# Patient Record
Sex: Female | Born: 1955
Health system: Southern US, Community
[De-identification: ages and names within clinical notes are randomized; demographics above are authoritative.]

## PROBLEM LIST (undated history)

## (undated) DIAGNOSIS — K8020 Calculus of gallbladder without cholecystitis without obstruction: Secondary | ICD-10-CM

## (undated) DIAGNOSIS — C801 Malignant (primary) neoplasm, unspecified: Secondary | ICD-10-CM

## (undated) DIAGNOSIS — I10 Essential (primary) hypertension: Secondary | ICD-10-CM

## (undated) DIAGNOSIS — G4730 Sleep apnea, unspecified: Secondary | ICD-10-CM

## (undated) DIAGNOSIS — F329 Major depressive disorder, single episode, unspecified: Secondary | ICD-10-CM

## (undated) DIAGNOSIS — F419 Anxiety disorder, unspecified: Secondary | ICD-10-CM

## (undated) DIAGNOSIS — F32A Depression, unspecified: Secondary | ICD-10-CM

## (undated) DIAGNOSIS — M1990 Unspecified osteoarthritis, unspecified site: Secondary | ICD-10-CM

## (undated) DIAGNOSIS — T7840XA Allergy, unspecified, initial encounter: Secondary | ICD-10-CM

## (undated) HISTORY — DX: Depression, unspecified: F32.A

## (undated) HISTORY — DX: Major depressive disorder, single episode, unspecified: F32.9

## (undated) HISTORY — DX: Sleep apnea, unspecified: G47.30

## (undated) HISTORY — PX: OTHER SURGICAL HISTORY: SHX169

## (undated) HISTORY — DX: Unspecified osteoarthritis, unspecified site: M19.90

## (undated) HISTORY — DX: Allergy, unspecified, initial encounter: T78.40XA

## (undated) HISTORY — DX: Anxiety disorder, unspecified: F41.9

---

## 1997-10-03 ENCOUNTER — Other Ambulatory Visit: Admission: RE | Admit: 1997-10-03 | Discharge: 1997-10-03 | Payer: Self-pay | Admitting: Obstetrics & Gynecology

## 1998-10-06 ENCOUNTER — Other Ambulatory Visit: Admission: RE | Admit: 1998-10-06 | Discharge: 1998-10-06 | Payer: Self-pay | Admitting: Obstetrics & Gynecology

## 1999-09-29 ENCOUNTER — Other Ambulatory Visit: Admission: RE | Admit: 1999-09-29 | Discharge: 1999-09-29 | Payer: Self-pay | Admitting: Obstetrics & Gynecology

## 2000-10-04 ENCOUNTER — Other Ambulatory Visit: Admission: RE | Admit: 2000-10-04 | Discharge: 2000-10-04 | Payer: Self-pay | Admitting: Obstetrics & Gynecology

## 2001-12-05 ENCOUNTER — Other Ambulatory Visit: Admission: RE | Admit: 2001-12-05 | Discharge: 2001-12-05 | Payer: Self-pay | Admitting: Obstetrics & Gynecology

## 2001-12-11 ENCOUNTER — Encounter: Payer: Self-pay | Admitting: Obstetrics & Gynecology

## 2001-12-11 ENCOUNTER — Encounter: Admission: RE | Admit: 2001-12-11 | Discharge: 2001-12-11 | Payer: Self-pay | Admitting: Obstetrics & Gynecology

## 2003-01-14 ENCOUNTER — Other Ambulatory Visit: Admission: RE | Admit: 2003-01-14 | Discharge: 2003-01-14 | Payer: Self-pay | Admitting: Obstetrics & Gynecology

## 2004-03-25 ENCOUNTER — Other Ambulatory Visit: Admission: RE | Admit: 2004-03-25 | Discharge: 2004-03-25 | Payer: Self-pay | Admitting: Obstetrics & Gynecology

## 2004-11-30 DIAGNOSIS — I10 Essential (primary) hypertension: Secondary | ICD-10-CM | POA: Insufficient documentation

## 2006-04-16 ENCOUNTER — Emergency Department: Payer: Self-pay | Admitting: Emergency Medicine

## 2008-03-05 ENCOUNTER — Inpatient Hospital Stay (HOSPITAL_COMMUNITY): Admission: EM | Admit: 2008-03-05 | Discharge: 2008-03-12 | Payer: Self-pay | Admitting: Emergency Medicine

## 2008-03-07 ENCOUNTER — Encounter (INDEPENDENT_AMBULATORY_CARE_PROVIDER_SITE_OTHER): Payer: Self-pay | Admitting: Orthopedic Surgery

## 2010-04-30 ENCOUNTER — Inpatient Hospital Stay: Payer: Self-pay | Admitting: Psychiatry

## 2010-05-24 LAB — COMPREHENSIVE METABOLIC PANEL
ALT: 17 U/L (ref 0–35)
AST: 55 U/L — ABNORMAL HIGH (ref 0–37)
Albumin: 2.4 g/dL — ABNORMAL LOW (ref 3.5–5.2)
Alkaline Phosphatase: 66 U/L (ref 39–117)
BUN: 3 mg/dL — ABNORMAL LOW (ref 6–23)
CO2: 29 mEq/L (ref 19–32)
Calcium: 7.8 mg/dL — ABNORMAL LOW (ref 8.4–10.5)
Chloride: 96 mEq/L (ref 96–112)
Creatinine, Ser: 0.77 mg/dL (ref 0.4–1.2)
GFR calc Af Amer: >60 mL/min (ref >60)
GFR calc non Af Amer: >60 mL/min (ref >60)
Glucose, Bld: 233 mg/dL — ABNORMAL HIGH (ref 70–99)
Potassium: 3 mEq/L — ABNORMAL LOW (ref 3.5–5.1)
Sodium: 134 mEq/L — ABNORMAL LOW (ref 135–145)
Total Bilirubin: 0.7 mg/dL (ref 0.3–1.2)
Total Protein: 4.7 g/dL — ABNORMAL LOW (ref 6.0–8.3)

## 2010-05-24 LAB — HEMOGLOBIN AND HEMATOCRIT, BLOOD
HCT: 23.5 % — ABNORMAL LOW (ref 36.0–46.0)
Hemoglobin: 8.1 g/dL — ABNORMAL LOW (ref 12.0–15.0)

## 2010-05-24 LAB — WOUND CULTURE

## 2010-05-24 LAB — CBC
HCT: 23.4 % — ABNORMAL LOW (ref 36.0–46.0)
HCT: 34.3 % — ABNORMAL LOW (ref 36.0–46.0)
Hemoglobin: 11.3 g/dL — ABNORMAL LOW (ref 12.0–15.0)
Hemoglobin: 7.9 g/dL — CL (ref 12.0–15.0)
MCHC: 33.6 g/dL (ref 30.0–36.0)
MCV: 94.4 fL (ref 78.0–100.0)
Platelets: 263 10*3/uL (ref 150–400)
RBC: 2.48 MIL/uL — ABNORMAL LOW (ref 3.87–5.11)
RBC: 3.6 MIL/uL — ABNORMAL LOW (ref 3.87–5.11)
RDW: 13.1 % (ref 11.5–15.5)
RDW: 13.4 % (ref 11.5–15.5)
WBC: 7.7 10*3/uL (ref 4.0–10.5)
WBC: 9.8 10*3/uL (ref 4.0–10.5)

## 2010-05-24 LAB — CROSSMATCH
ABO/RH(D): O POS
Antibody Screen: NEGATIVE

## 2010-05-24 LAB — SAMPLE TO BLOOD BANK

## 2010-05-24 LAB — URINALYSIS, MICROSCOPIC ONLY
Glucose, UA: NEGATIVE mg/dL
Leukocytes, UA: NEGATIVE
Nitrite: NEGATIVE
Specific Gravity, Urine: 1.011 (ref 1.005–1.030)
pH: 5 (ref 5.0–8.0)

## 2010-05-24 LAB — ANAEROBIC CULTURE

## 2010-05-24 LAB — RAPID URINE DRUG SCREEN, HOSP PERFORMED
Amphetamines: NOT DETECTED
Opiates: NOT DETECTED
Tetrahydrocannabinol: NOT DETECTED

## 2010-05-24 LAB — POCT I-STAT, CHEM 8
Calcium, Ion: 0.83 mmol/L — ABNORMAL LOW (ref 1.12–1.32)
Creatinine, Ser: 0.5 mg/dL (ref 0.4–1.2)
Glucose, Bld: 86 mg/dL (ref 70–99)
HCT: 34 % — ABNORMAL LOW (ref 36.0–46.0)
Hemoglobin: 11.6 g/dL — ABNORMAL LOW (ref 12.0–15.0)

## 2010-05-24 LAB — GRAM STAIN

## 2010-05-24 LAB — PROTIME-INR: INR: 1 (ref 0.00–1.49)

## 2010-05-24 LAB — POCT I-STAT 4, (NA,K, GLUC, HGB,HCT): Potassium: 3.4 mEq/L — ABNORMAL LOW (ref 3.5–5.1)

## 2010-05-24 LAB — ETHANOL: Alcohol, Ethyl (B): 236 mg/dL — ABNORMAL HIGH (ref 0–10)

## 2010-05-24 LAB — ABO/RH: ABO/RH(D): O POS

## 2010-05-25 LAB — URINALYSIS, ROUTINE W REFLEX MICROSCOPIC
Bilirubin Urine: NEGATIVE
Bilirubin Urine: NEGATIVE
Glucose, UA: NEGATIVE mg/dL
Glucose, UA: NEGATIVE mg/dL
Hgb urine dipstick: NEGATIVE
Ketones, ur: 15 mg/dL — AB
Ketones, ur: NEGATIVE mg/dL
Nitrite: NEGATIVE
Nitrite: NEGATIVE
Protein, ur: NEGATIVE mg/dL
Protein, ur: NEGATIVE mg/dL
Specific Gravity, Urine: 1.006 (ref 1.005–1.030)
Specific Gravity, Urine: 1.029 (ref 1.005–1.030)
Urobilinogen, UA: 0.2 mg/dL (ref 0.0–1.0)
Urobilinogen, UA: 0.2 mg/dL (ref 0.0–1.0)
pH: 6 (ref 5.0–8.0)
pH: 6.5 (ref 5.0–8.0)

## 2010-05-25 LAB — COMPREHENSIVE METABOLIC PANEL
ALT: 16 U/L (ref 0–35)
ALT: 18 U/L (ref 0–35)
AST: 25 U/L (ref 0–37)
AST: 30 U/L (ref 0–37)
Albumin: 2.3 g/dL — ABNORMAL LOW (ref 3.5–5.2)
Albumin: 2.3 g/dL — ABNORMAL LOW (ref 3.5–5.2)
Alkaline Phosphatase: 65 U/L (ref 39–117)
Alkaline Phosphatase: 80 U/L (ref 39–117)
BUN: 4 mg/dL — ABNORMAL LOW (ref 6–23)
BUN: 6 mg/dL (ref 6–23)
CO2: 29 mEq/L (ref 19–32)
CO2: 29 mEq/L (ref 19–32)
Calcium: 8 mg/dL — ABNORMAL LOW (ref 8.4–10.5)
Calcium: 8.3 mg/dL — ABNORMAL LOW (ref 8.4–10.5)
Chloride: 101 mEq/L (ref 96–112)
Chloride: 106 mEq/L (ref 96–112)
Creatinine, Ser: 0.53 mg/dL (ref 0.4–1.2)
Creatinine, Ser: 0.61 mg/dL (ref 0.4–1.2)
GFR calc Af Amer: >60 mL/min (ref >60)
GFR calc Af Amer: >60 mL/min (ref >60)
GFR calc non Af Amer: >60 mL/min (ref >60)
GFR calc non Af Amer: >60 mL/min (ref >60)
Glucose, Bld: 118 mg/dL — ABNORMAL HIGH (ref 70–99)
Glucose, Bld: 91 mg/dL (ref 70–99)
Potassium: 3.3 mEq/L — ABNORMAL LOW (ref 3.5–5.1)
Potassium: 3.4 mEq/L — ABNORMAL LOW (ref 3.5–5.1)
Sodium: 137 mEq/L (ref 135–145)
Sodium: 141 mEq/L (ref 135–145)
Total Bilirubin: 0.4 mg/dL (ref 0.3–1.2)
Total Bilirubin: 0.5 mg/dL (ref 0.3–1.2)
Total Protein: 4.9 g/dL — ABNORMAL LOW (ref 6.0–8.3)
Total Protein: 5 g/dL — ABNORMAL LOW (ref 6.0–8.3)

## 2010-05-25 LAB — URINE CULTURE
Colony Count: NO GROWTH
Colony Count: NO GROWTH
Culture: NO GROWTH
Culture: NO GROWTH

## 2010-05-25 LAB — URINE MICROSCOPIC-ADD ON

## 2010-05-25 LAB — CBC
HCT: 19.9 % — ABNORMAL LOW (ref 36.0–46.0)
HCT: 26 % — ABNORMAL LOW (ref 36.0–46.0)
HCT: 26.8 % — ABNORMAL LOW (ref 36.0–46.0)
Hemoglobin: 6.8 g/dL — CL (ref 12.0–15.0)
Hemoglobin: 8.8 g/dL — ABNORMAL LOW (ref 12.0–15.0)
Hemoglobin: 8.9 g/dL — ABNORMAL LOW (ref 12.0–15.0)
MCHC: 33.4 g/dL (ref 30.0–36.0)
MCHC: 34 g/dL (ref 30.0–36.0)
MCHC: 34 g/dL (ref 30.0–36.0)
MCV: 92.3 fL (ref 78.0–100.0)
MCV: 93.6 fL (ref 78.0–100.0)
MCV: 94.8 fL (ref 78.0–100.0)
Platelets: 292 10*3/uL (ref 150–400)
Platelets: 318 10*3/uL (ref 150–400)
Platelets: 351 10*3/uL (ref 150–400)
RBC: 2.1 MIL/uL — ABNORMAL LOW (ref 3.87–5.11)
RBC: 2.82 MIL/uL — ABNORMAL LOW (ref 3.87–5.11)
RBC: 2.86 MIL/uL — ABNORMAL LOW (ref 3.87–5.11)
RDW: 12.4 % (ref 11.5–15.5)
RDW: 13.3 % (ref 11.5–15.5)
RDW: 13.6 % (ref 11.5–15.5)
WBC: 7.4 10*3/uL (ref 4.0–10.5)
WBC: 7.9 10*3/uL (ref 4.0–10.5)
WBC: 7.9 10*3/uL (ref 4.0–10.5)

## 2010-05-25 LAB — BASIC METABOLIC PANEL
BUN: 5 mg/dL — ABNORMAL LOW (ref 6–23)
CO2: 27 mEq/L (ref 19–32)
Calcium: 8 mg/dL — ABNORMAL LOW (ref 8.4–10.5)
Chloride: 102 mEq/L (ref 96–112)
Creatinine, Ser: 0.49 mg/dL (ref 0.4–1.2)
GFR calc Af Amer: >60 mL/min (ref >60)
GFR calc non Af Amer: >60 mL/min (ref >60)
Glucose, Bld: 90 mg/dL (ref 70–99)
Potassium: 3.4 mEq/L — ABNORMAL LOW (ref 3.5–5.1)
Sodium: 136 mEq/L (ref 135–145)

## 2010-05-25 LAB — HEMOGLOBIN AND HEMATOCRIT, BLOOD
HCT: 19 % — ABNORMAL LOW (ref 36.0–46.0)
Hemoglobin: 6.6 g/dL — CL (ref 12.0–15.0)

## 2010-05-25 LAB — VITAMIN B12: Vitamin B-12: 487 pg/mL (ref 211–911)

## 2010-05-25 LAB — RETICULOCYTES
RBC.: 2.58 MIL/uL — ABNORMAL LOW (ref 3.87–5.11)
Retic Count, Absolute: 33.5 10*3/uL (ref 19.0–186.0)
Retic Ct Pct: 1.3 % (ref 0.4–3.1)

## 2010-05-25 LAB — FERRITIN: Ferritin: 194 ng/mL (ref 10–291)

## 2010-05-25 LAB — T4, FREE: Free T4: 0.93 ng/dL (ref 0.89–1.80)

## 2010-05-25 LAB — FOLATE: Folate: 19.3 ng/mL

## 2010-05-25 LAB — IRON: Iron: 14 ug/dL — ABNORMAL LOW (ref 42–135)

## 2010-05-25 LAB — TSH: TSH: 5.857 u[IU]/mL — ABNORMAL HIGH (ref 0.350–4.500)

## 2010-06-22 NOTE — Op Note (Signed)
Anne Waters, Anne Waters            ACCOUNT NO.:  [id number]   MEDICAL RECORD NO.:  [id number]          PATIENT TYPE:  INP   LOCATION:  5012                         FACILITY:  [facility]   PHYSICIAN:  Artist Pais. Weingold, M.D.DATE OF BIRTH:  16-Jan-1956   DATE OF PROCEDURE:  DATE OF DISCHARGE:                               OPERATIVE REPORT   PREOPERATIVE DIAGNOSIS:  Complex open injury, left hand and wrist.   POSTOPERATIVE DIAGNOSIS:  Complex open injury, left hand and wrist.   PROCEDURE:  Exploration and repair of left radial artery, left ulnar  artery, and the palmar arch, repair of 2 dorsal veins, repair of  extensor tendons x5, as well as fixation of open metacarpal fractures  and carpal dislocation x5 metacarpals, and carpal dislocation.   SURGEON:  Artist Pais. Mina Marble, MD   ASSISTANT:  None.   No tourniquet.   ANESTHESIA GIVEN:  General.   OPERATIVE REPORT:  The patient was taken to the operating suite.  After  induction of adequate general anesthesia, left upper extremity was  prepped and draped in usual sterile fashion.  Examination revealed a  near amputation of the left hand at the proximal metacarpal area.  Gross  examination revealed there was significant contamination with dirt and  grass etc., as well as what appeared to be an amputation with the  profundus tendons of the ring and small finger intact as well as what  appeared to be the ulnar nerve and median nerve, but they were  significantly crushed and flattened.  There was also significant bone  loss to the small and ring finger metacarpals as well as significant  destruction to the distal carpal bones.  The wound was thoroughly  irrigated and debrided with a liter of normal saline.  All nonviable  material was debrided.  Visualization and inspection revealed again the  previously mentioned soft tissue and osseous defects.  At this point in  time, under loupe magnification, the radial artery was dissected as well  as the ulnar artery proximally as well as the deep arch on the ulnar  side of the wrist and the radial artery distal to the forearm.  After  this was done, the microscope was brought onto the field.  The radial  artery was repaired with 9-0 nylon and 8-0 nylon.  The radial artery was  dilated proximally using lacrimal duct dilators until some flow was  evident, although it was quite weak, it was nonpulsatile.  The artery  was cut back until flow was obtained.  This was approximately 2 cm.  The  distal aspect was dilated, but not trimmed back significantly.  The  repair was done primarily under no undue tension.  Prior to our repair,  0.62 K-wires were driven through the metacarpal head and neck junction.  All of the digits passing into the carpal bones of the wrist to  stabilize the bone elements before microsurgery.  After the osseous  elements were stabilized, the microscope was brought into the field  again.  The radial artery was repaired as previously mentioned.  The  ulnar artery was repaired to the deep branch  of the deep arch using 9  and 10-0 nylon.  Again this was cut back until flow was obtained,  although it was not pulsatile despite multiple attempts at dilating.  After this was complete, there was some venous return, although quite  weak.  Two dorsal veins were identified and repaired using 10-0 nylon.  After this was completed, the wound was thoroughly irrigated with warm  saline.  There was significant soft tissue destruction on the lower  aspect including avulsions of the FPL, the FCR, as well as the flexor  tendons to the index and long fingers.  These were avulsed on the muscle  bellies.  These were sharply debrided as they were nonviable.  The  extensor tendons on the dorsal aspect of the wrist x5 repaired using 3-0  Ethibond.  After this was completed, the wound was again irrigated and  loosely closed with 4-0 nylon.  Final x-rays showed adequate reduction  of the  osseous elements with loss of the metacarpal bases of the ring  and small finger as well as some loss of the distal carpal bones  particularly on the ulnar side.  After the wound was loosely closed with  4-0 nylon, it was dressed with Xeroform, 4x4s, and a loosely bent volar  splint.  The patient had weak capillary refill and the hand was somewhat  colder distally than proximally.  The patient was taken to recovery room  in stable fashion.      Artist Pais Mina Marble, M.D.  Electronically Signed     MAW/MEDQ  D:  03/06/2008  T:  03/06/2008  Job:  [id number]
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
## 2010-06-22 NOTE — Op Note (Signed)
Anne Waters, Anne Waters            ACCOUNT NO.:  [id number]   MEDICAL RECORD NO.:  [id number]          PATIENT TYPE:  INP   LOCATION:  5012                         FACILITY:  [facility]   PHYSICIAN:  Artist Pais. Weingold, M.D.DATE OF BIRTH:  04-Apr-1955   DATE OF PROCEDURE:  03/09/2008  DATE OF DISCHARGE:                               OPERATIVE REPORT   PREOPERATIVE DIAGNOSES:  1. Traumatic injury, left upper extremity with attempted      revascularizations.  2. Traumatic amputation, left hand.  3. Left wrist disarticulation.   POSTOPERATIVE DIAGNOSES:  1. Traumatic injury, left upper extremity with attempted      revascularizations.  2. Traumatic amputation, left hand.  3. Left wrist disarticulation.   PROCEDURES:  Incision and drainage and secondary wound closure.   SURGEON:  Artist Pais. Mina Marble, MD   ASSISTANT:  None.   ANESTHESIA:  General.   TOURNIQUET TIME:  38 minutes.   COMPLICATIONS:  None.   DRAINS:  One drain.   OPERATIVE REPORT:  The patient was taken to the operating suite.  After  induction of adequate general anesthesia, left upper extremity was  prepped and draped in sterile fashion.  An Esmarch was used to  exsanguinate the limb.  Tourniquet was inflated to 250 mmHg.  At this  point in time, the patient's left upper extremity, which had been  undergone previous left wrist articulation 48 hours prior to today, was  approached surgically.  The packing was removed.  It was irrigated with  3L of normal saline.  At this point in time, debridement was undertaken  including neurectomies of the superficial radial, median, and ulnar  nurse sharply, which were drawn out to the end of the wound sharply and  transected as well as identification and tying off the radial and ulnar  arteries with 2-0 Vicryl ties.  After this was done, all devitalized  tissue was debrided and it was then loosely closed with 3-0 nylon over a  Penrose drain.  A large portion the dorsal skin,  which had been  devitalized was excised and a secondary closure was obtainable.  Once  this was done, tourniquet was released.  The wound was then dressed with  Xeroform, 4 x 4s and  compressive wrap, 0.25% plain Marcaine was also instilled.  The patient  had a volar splint applied of 4-inch plaster and ACE wraps for final  dressing.  The patient tolerated the procedures well and went to  recovery room in stable fashion.      Artist Pais Mina Marble, M.D.  Electronically Signed     MAW/MEDQ  D:  03/09/2008  T:  03/09/2008  Job:  [id number]
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
## 2010-06-22 NOTE — Op Note (Signed)
Anne Waters, Anne Waters            ACCOUNT NO.:  [id number]   MEDICAL RECORD NO.:  [id number]          PATIENT TYPE:  INP   LOCATION:  5012                         FACILITY:  [facility]   PHYSICIAN:  Artist Pais. Weingold, M.D.DATE OF BIRTH:  [birth year]   DATE OF PROCEDURE:  03/07/2008  DATE OF DISCHARGE:                               OPERATIVE REPORT   PREOPERATIVE DIAGNOSES:  Near amputation and grossly contaminated left  hand and wrist.   POSTOPERATIVE DIAGNOSES:  Near amputation and grossly contaminated left  hand and wrist.   PROCEDURES:  Exploration, dressing change, evaluation of microscopic  anastomosis of radial and ulnar arteries, and left wrist disarticulation  with open packing of the wound.   SURGEON:  Artist Pais. Mina Marble, MD   ASSISTANT:  None.   ANESTHESIA:  General.   TOURNIQUET TIME:  12 minutes.   COMPLICATIONS:  None.   DRAINS:  None.   DESCRIPTION OF PROCEDURE:  The patient was taken to the operating suite  and after induction of adequate general anesthesia, left upper extremity  dressing was removed.  The proximal aspect of the forearm and wrist was  warm with good blood flow with needle pricking with 25-gauge needle,  however, distal to the anastomosis sites and repair sites, there was no  blood flow evident with signs of early gangrene.  There was also malodor  coming from the wound.  The hand was then prepped and draped in the  usual sterile fashion and at this point in time, the sutures were  removed.  The anastomosis sites of 2 veins and 2 arteries were  inspected.  There was clot evident at both the anastomosis sites.  The  anastomosis sites, however, appeared intact.  Just proximal to the  anastomosis site, the radial and ulnar arteries were transected and  visualization revealed clot to the level of the distal third of the  forearm.  There was a clot to the level of the distal third of the  forearm.  This included radial and ulnar arteries.  We also  inspected  the venous anastomosis, which was intact and there was no significant  blood in the vessels.  At this point in time, the wound was thoroughly  cultured and due to the patient's fever of 102 and the fact, there was  no blood flow into the hand, a decision was made to perform a wrist  disarticulation.  Remaining sutures were removed.  The hand was removed  from the wrist and sent for pathology.  The wound was then thoroughly  irrigated with a power irrigator and debridement was taken of all  devitalized tissues including the distal and proximal carpal rows down  to the level of the distal radius and ulna.  At this point in time, the  midline incision which had been made at a prior surgery and was  used to inspect the vessels was closed with 3-0 nylon; however, the  remaining distal aspect was packed open with Kerlix, ABDs, cast padding,  and a compressive wrap.  Again, cultures were taken and debridement as  necessary was undertaken.  The patient went to  the recovery room in  stable fashion and tolerated the procedure well.       Artist Pais Mina Marble, M.D.  Electronically Signed     MAW/MEDQ  D:  03/07/2008  T:  03/08/2008  Job:  [id number]
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
## 2012-06-15 ENCOUNTER — Ambulatory Visit: Payer: Self-pay | Admitting: Family Medicine

## 2013-12-20 ENCOUNTER — Other Ambulatory Visit: Payer: Self-pay | Admitting: Obstetrics & Gynecology

## 2014-07-28 ENCOUNTER — Ambulatory Visit (INDEPENDENT_AMBULATORY_CARE_PROVIDER_SITE_OTHER): Payer: Medicare Other | Admitting: Physician Assistant

## 2014-07-28 ENCOUNTER — Encounter: Payer: Self-pay | Admitting: Physician Assistant

## 2014-07-28 VITALS — BP 100/60 | HR 84 | Temp 98.3°F | Resp 16 | Ht 66.0 in | Wt 220.0 lb

## 2014-07-28 DIAGNOSIS — R5382 Chronic fatigue, unspecified: Secondary | ICD-10-CM | POA: Diagnosis not present

## 2014-07-28 DIAGNOSIS — G4733 Obstructive sleep apnea (adult) (pediatric): Secondary | ICD-10-CM

## 2014-07-28 DIAGNOSIS — Z1211 Encounter for screening for malignant neoplasm of colon: Secondary | ICD-10-CM | POA: Diagnosis not present

## 2014-07-28 DIAGNOSIS — G4730 Sleep apnea, unspecified: Secondary | ICD-10-CM

## 2014-07-28 NOTE — Patient Instructions (Signed)
Sleep Apnea  Sleep apnea is a sleep disorder characterized by abnormal pauses in breathing while you sleep. When your breathing pauses, the level of oxygen in your blood decreases. This causes you to move out of deep sleep and into light sleep. As a result, your quality of sleep is poor, and the system that carries your blood throughout your body (cardiovascular system) experiences stress. If sleep apnea remains untreated, the following conditions can develop:  High blood pressure (hypertension).  Coronary artery disease.  Inability to achieve or maintain an erection (impotence).  Impairment of your thought process (cognitive dysfunction). There are three types of sleep apnea: 1. Obstructive sleep apnea--Pauses in breathing during sleep because of a blocked airway. 2. Central sleep apnea--Pauses in breathing during sleep because the area of the brain that controls your breathing does not send the correct signals to the muscles that control breathing. 3. Mixed sleep apnea--A combination of both obstructive and central sleep apnea. RISK FACTORS The following risk factors can increase your risk of developing sleep apnea:  Being overweight.  Smoking.  Having narrow passages in your nose and throat.  Being of older age.  Being female.  Alcohol use.  Sedative and tranquilizer use.  Ethnicity. Among individuals younger than 35 years, African Americans are at increased risk of sleep apnea. SYMPTOMS   Difficulty staying asleep.  Daytime sleepiness and fatigue.  Loss of energy.  Irritability.  Loud, heavy snoring.  Morning headaches.  Trouble concentrating.  Forgetfulness.  Decreased interest in sex. DIAGNOSIS  In order to diagnose sleep apnea, your caregiver will perform a physical examination. Your caregiver may suggest that you take a home sleep test. Your caregiver may also recommend that you spend the night in a sleep lab. In the sleep lab, several monitors record  information about your heart, lungs, and brain while you sleep. Your leg and arm movements and blood oxygen level are also recorded. TREATMENT The following actions may help to resolve mild sleep apnea:  Sleeping on your side.   Using a decongestant if you have nasal congestion.   Avoiding the use of depressants, including alcohol, sedatives, and narcotics.   Losing weight and modifying your diet if you are overweight. There also are devices and treatments to help open your airway:  Oral appliances. These are custom-made mouthpieces that shift your lower jaw forward and slightly open your bite. This opens your airway.  Devices that create positive airway pressure. This positive pressure "splints" your airway open to help you breathe better during sleep. The following devices create positive airway pressure:  Continuous positive airway pressure (CPAP) device. The CPAP device creates a continuous level of air pressure with an air pump. The air is delivered to your airway through a mask while you sleep. This continuous pressure keeps your airway open.  Nasal expiratory positive airway pressure (EPAP) device. The EPAP device creates positive air pressure as you exhale. The device consists of single-use valves, which are inserted into each nostril and held in place by adhesive. The valves create very little resistance when you inhale but create much more resistance when you exhale. That increased resistance creates the positive airway pressure. This positive pressure while you exhale keeps your airway open, making it easier to breath when you inhale again.  Bilevel positive airway pressure (BPAP) device. The BPAP device is used mainly in patients with central sleep apnea. This device is similar to the CPAP device because it also uses an air pump to deliver continuous air pressure   through a mask. However, with the BPAP machine, the pressure is set at two different levels. The pressure when you  exhale is lower than the pressure when you inhale.  Surgery. Typically, surgery is only done if you cannot comply with less invasive treatments or if the less invasive treatments do not improve your condition. Surgery involves removing excess tissue in your airway to create a wider passage way. Document Released: 01/14/2002 Document Revised: 05/21/2012 Document Reviewed: 06/02/2011 Exit[employer] Patient Information 2015 [employer]. This information is not intended to replace advice given to you by your health care provider. Make sure you discuss any questions you have with your health care provider.  

## 2014-07-28 NOTE — Progress Notes (Signed)
Subjective:    Patient ID: Anne Waters, female    DOB: [birth year], 60 y.o.   MRN: [id number]  HPI Fatigue: Patient complains of fatigue. Symptoms began several months ago. Sentinal symptom the patient feels fatigue began with: witnessed or suspected sleep apnea. Symptoms of her fatigue have been gradually worsening, pt reports that she feel un rested even after a full night of sleep. Patient describes the following psychologic symptoms: none.  Patient denies none. Symptoms have gradually worsened. Severity has been symptoms bothersome, but easily able to carry out all usual work/school/family activities. Previous visits for this problem: none. Patient reports that she has had a sleep study in 2008 and reports that she never got a CPAP. Patient is requesting sleep study for CPAP machine.    Review of Systems  Constitutional: Positive for activity change.  Respiratory: Positive for apnea. Negative for cough, choking and shortness of breath.   Cardiovascular: Negative.   Musculoskeletal: Positive for arthralgias (right hand several days a week; constant left arm pain).  Neurological: Positive for numbness.  Psychiatric/Behavioral: Negative.        Patient Active Problem List   Diagnosis Date Noted  . Essential (primary) hypertension 11/30/2004   Past Medical History  Diagnosis Date  . Allergy   . Depression    Current Outpatient Prescriptions on File Prior to Visit  Medication Sig  . DULoxetine (CYMBALTA) 60 MG capsule Take 1 capsule by mouth daily.  . Multiple Vitamin (MULTI VITAMIN DAILY PO) 1 tablet 2 (two) times daily.  Marland Kitchen oxybutynin (DITROPAN XL) 10 MG 24 hr tablet Take 1 tablet by mouth daily.   No current facility-administered medications on file prior to visit.   Allergies  Allergen Reactions  . Sulfa Antibiotics    Past Surgical History  Procedure Laterality Date  . Arm and hand surgery Bilateral     2010 left; 2011 right   History   Social History  .  Marital Status: Single    Spouse Name: N/A  . Number of Children: N/A  . Years of Education: N/A   Occupational History  . Not on file.   Social History Main Topics  . Smoking status: Never Smoker   . Smokeless tobacco: Never Used  . Alcohol Use: No  . Drug Use: No  . Sexual Activity: Not on file   Other Topics Concern  . Not on file   Social History Narrative   Family History  Problem Relation Age of Onset  . Cataracts Mother   . Arthritis Mother   . Heart attack Maternal Grandmother   . Hypertension Maternal Grandmother   . Hypertension Maternal Grandfather   . CVA Maternal Grandfather   . Alzheimer's disease Maternal Grandfather   . CVA Paternal Grandmother   . Alzheimer's disease Paternal Grandmother   . Hypertension Paternal Grandmother   . Heart attack Paternal Grandfather   . Healthy Daughter   . Healthy Daughter      Objective:   Physical Exam  Constitutional: Vital signs are normal. She appears well-developed. No distress.  Obese  Cardiovascular: Normal rate, regular rhythm, normal heart sounds and intact distal pulses.  Exam reveals no gallop and no friction rub.   No murmur heard. Pulmonary/Chest: Effort normal and breath sounds normal. No respiratory distress. She has no wheezes. She has no rales. She exhibits no tenderness.  Skin: She is not diaphoretic.    Blood pressure 100/60, pulse 84, temperature 98.3 F (36.8 C), temperature source Oral, resp. rate 16,  height 5\' 6"  (1.676 m), weight 220 lb (99.791 kg), SpO2 96 %.       Assessment & Plan:  1. Chronic fatigue Will repeat sleep study.  She had one on 2008 for fatigue and reports of not feeling well rested after sleep.  The results indicated obstructive sleep apnea and recommended CPAP.  She reports never being notified and never received a CPAP.  No improvements in sleep.  - Nocturnal polysomnography (NPSG); Future  2. Sleep apnea in adult Will repeat sleep study.  She had one on 2008 for  fatigue and reports of not feeling well rested after sleep.  The results indicated obstructive sleep apnea and recommended CPAP.  She reports never being notified and never received a CPAP.  No improvements in sleep.  - Nocturnal polysomnography (NPSG); Future  3. Colon cancer screening Will fax in order for cologuard testing.  Patient signed in office today. - Cologuard

## 2014-07-29 NOTE — Addendum Note (Signed)
Addended by: Mar Daring on: 07/29/2014 10:15 AM   Modules accepted: Orders

## 2014-08-14 ENCOUNTER — Ambulatory Visit: Payer: Self-pay | Admitting: Family Medicine

## 2014-08-28 ENCOUNTER — Ambulatory Visit: Payer: Medicare Other | Attending: Otolaryngology

## 2014-08-28 DIAGNOSIS — G4733 Obstructive sleep apnea (adult) (pediatric): Secondary | ICD-10-CM | POA: Insufficient documentation

## 2014-08-28 DIAGNOSIS — G2581 Restless legs syndrome: Secondary | ICD-10-CM | POA: Diagnosis not present

## 2014-08-28 DIAGNOSIS — R0683 Snoring: Secondary | ICD-10-CM | POA: Diagnosis not present

## 2014-10-01 ENCOUNTER — Ambulatory Visit: Payer: Medicare Other | Attending: Otolaryngology

## 2014-10-01 DIAGNOSIS — G4733 Obstructive sleep apnea (adult) (pediatric): Secondary | ICD-10-CM | POA: Insufficient documentation

## 2014-10-01 DIAGNOSIS — R0683 Snoring: Secondary | ICD-10-CM | POA: Insufficient documentation

## 2014-10-06 ENCOUNTER — Other Ambulatory Visit: Payer: Self-pay | Admitting: Obstetrics & Gynecology

## 2014-10-07 LAB — CYTOLOGY - PAP

## 2014-10-31 ENCOUNTER — Encounter: Payer: Self-pay | Admitting: Physician Assistant

## 2015-01-15 LAB — COLOGUARD

## 2015-01-28 ENCOUNTER — Ambulatory Visit (INDEPENDENT_AMBULATORY_CARE_PROVIDER_SITE_OTHER): Payer: Medicare Other | Admitting: Physician Assistant

## 2015-01-28 ENCOUNTER — Encounter: Payer: Self-pay | Admitting: Physician Assistant

## 2015-01-28 VITALS — BP 130/80 | HR 79 | Temp 98.6°F | Resp 16 | Wt 230.8 lb

## 2015-01-28 DIAGNOSIS — G4733 Obstructive sleep apnea (adult) (pediatric): Secondary | ICD-10-CM | POA: Diagnosis not present

## 2015-01-28 DIAGNOSIS — Z9989 Dependence on other enabling machines and devices: Principal | ICD-10-CM

## 2015-01-28 NOTE — Patient Instructions (Signed)
Sleep Apnea  Sleep apnea is a sleep disorder characterized by abnormal pauses in breathing while you sleep. When your breathing pauses, the level of oxygen in your blood decreases. This causes you to move out of deep sleep and into light sleep. As a result, your quality of sleep is poor, and the system that carries your blood throughout your body (cardiovascular system) experiences stress. If sleep apnea remains untreated, the following conditions can develop:  High blood pressure (hypertension).  Coronary artery disease.  Inability to achieve or maintain an erection (impotence).  Impairment of your thought process (cognitive dysfunction). There are three types of sleep apnea: 1. Obstructive sleep apnea--Pauses in breathing during sleep because of a blocked airway. 2. Central sleep apnea--Pauses in breathing during sleep because the area of the brain that controls your breathing does not send the correct signals to the muscles that control breathing. 3. Mixed sleep apnea--A combination of both obstructive and central sleep apnea. RISK FACTORS The following risk factors can increase your risk of developing sleep apnea:  Being overweight.  Smoking.  Having narrow passages in your nose and throat.  Being of older age.  Being female.  Alcohol use.  Sedative and tranquilizer use.  Ethnicity. Among individuals younger than 35 years, African Americans are at increased risk of sleep apnea. SYMPTOMS   Difficulty staying asleep.  Daytime sleepiness and fatigue.  Loss of energy.  Irritability.  Loud, heavy snoring.  Morning headaches.  Trouble concentrating.  Forgetfulness.  Decreased interest in sex.  Unexplained sleepiness. DIAGNOSIS  In order to diagnose sleep apnea, your caregiver will perform a physical examination. A sleep study done in the comfort of your own home may be appropriate if you are otherwise healthy. Your caregiver may also recommend that you spend the  night in a sleep lab. In the sleep lab, several monitors record information about your heart, lungs, and brain while you sleep. Your leg and arm movements and blood oxygen level are also recorded. TREATMENT The following actions may help to resolve mild sleep apnea:  Sleeping on your side.   Using a decongestant if you have nasal congestion.   Avoiding the use of depressants, including alcohol, sedatives, and narcotics.   Losing weight and modifying your diet if you are overweight. There also are devices and treatments to help open your airway:  Oral appliances. These are custom-made mouthpieces that shift your lower jaw forward and slightly open your bite. This opens your airway.  Devices that create positive airway pressure. This positive pressure "splints" your airway open to help you breathe better during sleep. The following devices create positive airway pressure:  Continuous positive airway pressure (CPAP) device. The CPAP device creates a continuous level of air pressure with an air pump. The air is delivered to your airway through a mask while you sleep. This continuous pressure keeps your airway open.  Nasal expiratory positive airway pressure (EPAP) device. The EPAP device creates positive air pressure as you exhale. The device consists of single-use valves, which are inserted into each nostril and held in place by adhesive. The valves create very little resistance when you inhale but create much more resistance when you exhale. That increased resistance creates the positive airway pressure. This positive pressure while you exhale keeps your airway open, making it easier to breath when you inhale again.  Bilevel positive airway pressure (BPAP) device. The BPAP device is used mainly in patients with central sleep apnea. This device is similar to the CPAP device because   it also uses an air pump to deliver continuous air pressure through a mask. However, with the BPAP machine, the  pressure is set at two different levels. The pressure when you exhale is lower than the pressure when you inhale.  Surgery. Typically, surgery is only done if you cannot comply with less invasive treatments or if the less invasive treatments do not improve your condition. Surgery involves removing excess tissue in your airway to create a wider passage way.   This information is not intended to replace advice given to you by your health care provider. Make sure you discuss any questions you have with your health care provider.   Document Released: 01/14/2002 Document Revised: 02/14/2014 Document Reviewed: 06/02/2011 Elsevier Interactive Patient Education 2016 [employer]   

## 2015-01-28 NOTE — Progress Notes (Signed)
Patient: Anne Waters Female    DOB: 1955-09-26   59 y.o.   MRN: [id number] Visit Date: 01/28/2015  Today's Provider: Mar Daring, PA-C   Chief Complaint  Patient presents with  . Apnea    Patient needs a new order for her cpap   Subjective:    HPI  Anne Waters is here today because needs a prescription for supplies for her Cpap machine. Per patient is sleeping well. Patient states she loves her machine. Breathing better and feels more rested. She states that she has noticed a drastic improvement in the way she feels, her mood and energy level since starting the CPAP machine. She states that she has only went 2 nights where she did not sleep with it and this was due to other circumstances.  She also states that her and her husband are going to be starting a weight loss program, January. She seems very excited and enthusiastic to start this program. I did advise her that if she does lose weight and her mask starts to fit differently for her to please let me know as we may need to have that readjusted with weight loss.     Allergies  Allergen Reactions  . Sulfa Antibiotics    Previous Medications   DULOXETINE (CYMBALTA) 60 MG CAPSULE    Take 1 capsule by mouth daily.   ESTRADIOL (ESTRACE) 1 MG TABLET    Take 1 mg by mouth daily.   MELOXICAM (MOBIC) 15 MG TABLET    TAKE 1 TABLET BY MOUTH DAILY WITH MEALS   MULTIPLE VITAMIN (MULTI VITAMIN DAILY PO)    1 tablet 2 (two) times daily.   OXYBUTYNIN (DITROPAN XL) 10 MG 24 HR TABLET    Take 1 tablet by mouth daily.   PROGESTERONE (PROMETRIUM) 100 MG CAPSULE    TAKE 1 CAPSULE EACH NIGHT AT BEDTIME    Review of Systems  Constitutional: Negative.   Respiratory: Negative.   Cardiovascular: Negative.   Gastrointestinal: Negative.   Neurological: Negative.   Psychiatric/Behavioral: Negative.     Social History  Substance Use Topics  . Smoking status: Never Smoker   . Smokeless tobacco: Never Used  . Alcohol  Use: No   Objective:   There were no vitals taken for this visit.  Physical Exam  Constitutional: She appears well-developed and well-nourished. No distress.  Cardiovascular: Normal rate, regular rhythm and normal heart sounds.  Exam reveals no gallop and no friction rub.   No murmur heard. Pulmonary/Chest: Effort normal and breath sounds normal. No respiratory distress. She has no wheezes. She has no rales.  Skin: She is not diaphoretic.  Psychiatric: She has a normal mood and affect. Her behavior is normal. Judgment and thought content normal.  Vitals reviewed.       Assessment & Plan:     1. OSA on CPAP I did give her a written prescription today that was [facility] to [facility] for her CPAP supplies including but not limited to mask, cushions, pillows, filters, heated tubing, and humidity. We have faxed this over to sleep that already and if they need more information we will get that to them as well so that she can have her lifetime supplies. I will see her back in 6 months to evaluate how she is doing with the CPAP as well as how she is doing with her weight loss program that she and her husband are going to be starting in January. She  is to call the office in the meantime if she has any acute issues, questions or concerns.       Mar Daring, PA-C  [facility]

## 2015-02-11 ENCOUNTER — Ambulatory Visit (INDEPENDENT_AMBULATORY_CARE_PROVIDER_SITE_OTHER): Payer: Medicare Other | Admitting: Physician Assistant

## 2015-02-11 ENCOUNTER — Encounter: Payer: Self-pay | Admitting: Physician Assistant

## 2015-02-11 VITALS — BP 132/80 | HR 74 | Temp 98.4°F | Resp 16 | Wt 228.6 lb

## 2015-02-11 DIAGNOSIS — L0292 Furuncle, unspecified: Secondary | ICD-10-CM

## 2015-02-11 MED ORDER — CEPHALEXIN 500 MG PO CAPS: 500.0000 mg | ORAL_CAPSULE | Freq: Four times a day (QID) | ORAL | Status: DC

## 2015-02-11 NOTE — Progress Notes (Signed)
       Patient: Anne Waters Female    DOB: 03-21-1955   60 y.o.   MRN: [id number] Visit Date: 02/11/2015  Today's Provider: Mar Daring, PA-C   Chief Complaint  Patient presents with  . Cyst on Chest   Subjective:    HPI  Patient is here today concern about a Cyst on her chest. Is in the middle of her chest. Patient has a dermatologist appointment coming up. Patient states the cyst was red, hot to touched and it discharged was coming out from it. Now it just itching and the discharge still coming out since it pop on 26 th of December. Patient put Neosporin and has been cleaning the area. No fever.     Allergies  Allergen Reactions  . Sulfa Antibiotics    Previous Medications   DULOXETINE (CYMBALTA) 60 MG CAPSULE    Take 1 capsule by mouth daily.   ESTRADIOL (ESTRACE) 1 MG TABLET    Take 1 mg by mouth daily.   MELOXICAM (MOBIC) 15 MG TABLET       MULTIPLE VITAMIN (MULTI VITAMIN DAILY PO)    1 tablet 2 (two) times daily.   OXYBUTYNIN (DITROPAN XL) 10 MG 24 HR TABLET    Take 1 tablet by mouth daily.   PROGESTERONE (PROMETRIUM) 100 MG CAPSULE    TAKE 1 CAPSULE EACH NIGHT AT BEDTIME    Review of Systems  Constitutional: Negative for fever, chills and fatigue.  Respiratory: Negative.   Cardiovascular: Negative.   Gastrointestinal: Negative.   Skin: Positive for wound.  All other systems reviewed and are negative.   Social History  Substance Use Topics  . Smoking status: Never Smoker   . Smokeless tobacco: Never Used  . Alcohol Use: No   Objective:   BP 132/80 mmHg  Pulse 74  Temp(Src) 98.4 F (36.9 C) (Oral)  Resp 16  Wt 228 lb 9.6 oz (103.692 kg)  Physical Exam  Constitutional: She appears well-developed and well-nourished. No distress.  Cardiovascular: Normal rate, regular rhythm and normal heart sounds.  Exam reveals no gallop and no friction rub.   No murmur heard. Pulmonary/Chest: Effort normal and breath sounds normal. No respiratory distress.  She has no wheezes. She has no rales. She exhibits mass.    Skin: She is not diaphoretic.  Vitals reviewed.       Assessment & Plan:     1. Boil Will treat with keflex as below.  No I&D since it is auto-draining.  No signs of systemic infection. She does have a f/u with dermatology on 03/03/15.  She is to call the office if symptoms worsen or fail to improve following completing antibiotic and if before dermatology appointment. - cephALEXin (KEFLEX) 500 MG capsule; Take 1 capsule (500 mg total) by mouth 4 (four) times daily.  Dispense: 20 capsule; Refill: 0       Mar Daring, PA-C  [facility]

## 2015-02-11 NOTE — Patient Instructions (Signed)

## 2015-02-24 ENCOUNTER — Encounter: Payer: Self-pay | Admitting: Family Medicine

## 2015-03-25 ENCOUNTER — Telehealth: Payer: Self-pay | Admitting: Physician Assistant

## 2015-03-25 NOTE — Telephone Encounter (Signed)
Vicente Males with [facility] would like to speak with a nurse to see if pt had a f/u appt since Oct 2016 for compliance after her sleep study. Thanks TNP

## 2015-03-25 NOTE — Telephone Encounter (Signed)
Spoke with Vicente Males and informed her patient was seen 01/2015.  Thanks,  -Joseline

## 2015-05-08 ENCOUNTER — Encounter: Payer: Self-pay | Admitting: Physician Assistant

## 2015-07-29 ENCOUNTER — Ambulatory Visit: Payer: Medicare Other | Admitting: Physician Assistant

## 2015-08-13 DIAGNOSIS — M19049 Primary osteoarthritis, unspecified hand: Secondary | ICD-10-CM | POA: Insufficient documentation

## 2015-12-01 ENCOUNTER — Telehealth: Payer: Self-pay | Admitting: Physician Assistant

## 2016-02-10 ENCOUNTER — Encounter: Payer: Self-pay | Admitting: Physician Assistant

## 2016-02-10 ENCOUNTER — Ambulatory Visit (INDEPENDENT_AMBULATORY_CARE_PROVIDER_SITE_OTHER): Payer: Medicare Other | Admitting: Physician Assistant

## 2016-02-10 VITALS — BP 100/70 | HR 80 | Temp 98.5°F | Resp 16 | Wt 215.0 lb

## 2016-02-10 DIAGNOSIS — J0140 Acute pansinusitis, unspecified: Secondary | ICD-10-CM

## 2016-02-10 DIAGNOSIS — H66002 Acute suppurative otitis media without spontaneous rupture of ear drum, left ear: Secondary | ICD-10-CM

## 2016-02-10 MED ORDER — AMOXICILLIN-POT CLAVULANATE 875-125 MG PO TABS: 1.0000 | ORAL_TABLET | Freq: Two times a day (BID) | ORAL | 0 refills | Status: DC

## 2016-02-10 NOTE — Patient Instructions (Signed)
Otitis Media, Adult Otitis media is redness, soreness, and puffiness (swelling) in the space just behind your eardrum (middle ear). It may be caused by allergies or infection. It often happens along with a cold. Follow these instructions at home:  Take your medicine as told. Finish it even if you start to feel better.  Only take over-the-counter or prescription medicines for pain, discomfort, or fever as told by your doctor.  Follow up with your doctor as told. Contact a doctor if:  You have otitis media only in one ear, or bleeding from your nose, or both.  You notice a lump on your neck.  You are not getting better in 3-5 days.  You feel worse instead of better. Get help right away if:  You have pain that is not helped with medicine.  You have puffiness, redness, or pain around your ear.  You get a stiff neck.  You cannot move part of your face (paralysis).  You notice that the bone behind your ear hurts when you touch it. This information is not intended to replace advice given to you by your health care provider. Make sure you discuss any questions you have with your health care provider. Document Released: 07/13/2007 Document Revised: 07/02/2015 Document Reviewed: 08/21/2012 Elsevier Interactive Patient Education  2017 [employer] Sinusitis, Adult Sinusitis is soreness and inflammation of your sinuses. Sinuses are hollow spaces in the bones around your face. Your sinuses are located:  Around your eyes.  In the middle of your forehead.  Behind your nose.  In your cheekbones. Your sinuses and nasal passages are lined with a stringy fluid (mucus). Mucus normally drains out of your sinuses. When your nasal tissues become inflamed or swollen, the mucus can become trapped or blocked so air cannot flow through your sinuses. This allows bacteria, viruses, and funguses to grow, which leads to infection. Sinusitis can develop quickly and last for 7?10 days (acute) or for more  than 12 weeks (chronic). Sinusitis often develops after a cold. What are the causes? This condition is caused by anything that creates swelling in the sinuses or stops mucus from draining, including:  Allergies.  Asthma.  Bacterial or viral infection.  Abnormally shaped bones between the nasal passages.  Nasal growths that contain mucus (nasal polyps).  Narrow sinus openings.  Pollutants, such as chemicals or irritants in the air.  A foreign object stuck in the nose.  A fungal infection. This is rare. What increases the risk? The following factors may make you more likely to develop this condition:  Having allergies or asthma.  Having had a recent cold or respiratory tract infection.  Having structural deformities or blockages in your nose or sinuses.  Having a weak immune system.  Doing a lot of swimming or diving.  Overusing nasal sprays.  Smoking. What are the signs or symptoms? The main symptoms of this condition are pain and a feeling of pressure around the affected sinuses. Other symptoms include:  Upper toothache.  Earache.  Headache.  Bad breath.  Decreased sense of smell and taste.  A cough that may get worse at night.  Fatigue.  Fever.  Thick drainage from your nose. The drainage is often green and it may contain pus (purulent).  Stuffy nose or congestion.  Postnasal drip. This is when extra mucus collects in the throat or back of the nose.  Swelling and warmth over the affected sinuses.  Sore throat.  Sensitivity to light. How is this diagnosed? This condition is diagnosed  based on symptoms, a medical history, and a physical exam. To find out if your condition is acute or chronic, your health care provider may:  Look in your nose for signs of nasal polyps.  Tap over the affected sinus to check for signs of infection.  View the inside of your sinuses using an imaging device that has a light attached (endoscope). If your health care  provider suspects that you have chronic sinusitis, you may also:  Be tested for allergies.  Have a sample of mucus taken from your nose (nasal culture) and checked for bacteria.  Have a mucus sample examined to see if your sinusitis is related to an allergy. If your sinusitis does not respond to treatment and it lasts longer than 8 weeks, you may have an MRI or CT scan to check your sinuses. These scans also help to determine how severe your infection is. In rare cases, a bone biopsy may be done to rule out more serious types of fungal sinus disease. How is this treated? Treatment for sinusitis depends on the cause and whether your condition is chronic or acute. If a virus is causing your sinusitis, your symptoms will go away on their own within 10 days. You may be given medicines to relieve your symptoms, including:  Topical nasal decongestants. They shrink swollen nasal passages and let mucus drain from your sinuses.  Antihistamines. These drugs block inflammation that is triggered by allergies. This can help to ease swelling in your nose and sinuses.  Topical nasal corticosteroids. These are nasal sprays that ease inflammation and swelling in your nose and sinuses.  Nasal saline washes. These rinses can help to get rid of thick mucus in your nose. If your condition is caused by bacteria, you will be given an antibiotic medicine. If your condition is caused by a fungus, you will be given an antifungal medicine. Surgery may be needed to correct underlying conditions, such as narrow nasal passages. Surgery may also be needed to remove polyps. Follow these instructions at home: Medicines  Take, use, or apply over-the-counter and prescription medicines only as told by your health care provider. These may include nasal sprays.  If you were prescribed an antibiotic medicine, take it as told by your health care provider. Do not stop taking the antibiotic even if you start to feel  better. Hydrate and Humidify  Drink enough water to keep your urine clear or pale yellow. Staying hydrated will help to thin your mucus.  Use a cool mist humidifier to keep the humidity level in your home above 50%.  Inhale steam for 10-15 minutes, 3-4 times a day or as told by your health care provider. You can do this in the bathroom while a hot shower is running.  Limit your exposure to cool or dry air. Rest  Rest as much as possible.  Sleep with your head raised (elevated).  Make sure to get enough sleep each night. General instructions  Apply a warm, moist washcloth to your face 3-4 times a day or as told by your health care provider. This will help with discomfort.  Wash your hands often with soap and water to reduce your exposure to viruses and other germs. If soap and water are not available, use hand sanitizer.  Do not smoke. Avoid being around people who are smoking (secondhand smoke).  Keep all follow-up visits as told by your health care provider. This is important. Contact a health care provider if:  You have a fever.  Your symptoms get worse.  Your symptoms do not improve within 10 days. Get help right away if:  You have a severe headache.  You have persistent vomiting.  You have pain or swelling around your face or eyes.  You have vision problems.  You develop confusion.  Your neck is stiff.  You have trouble breathing. This information is not intended to replace advice given to you by your health care provider. Make sure you discuss any questions you have with your health care provider. Document Released: 01/24/2005 Document Revised: 09/20/2015 Document Reviewed: 11/19/2014 Elsevier Interactive Patient Education  2017 [employer].

## 2016-02-10 NOTE — Progress Notes (Signed)
Patient: Anne Waters Female    DOB: 03/13/1955   61 y.o.   MRN: [id number] Visit Date: 02/10/2016  Today's Provider: Mar Daring, PA-C   Chief Complaint  Patient presents with  . Sinusitis   Subjective:    HPI Sinusitis: Patient presents with sinusitis.  Her symptoms include nasal congestion, sore throats, facial pain, teeth pain and low grade fever. Patient reports symptoms have been present for about 3 days. There has not been a history of purulent rhinorrhea. Other medications have included oral decongestants. Patient reports her temp was 99.1 this morning and reports she has been taking Dayquil.       Allergies  Allergen Reactions  . Hydrocodone-Acetaminophen     Other reaction(s): VOMITING  . Sulfa Antibiotics      Current Outpatient Prescriptions:  .  DULoxetine (CYMBALTA) 60 MG capsule, Take 1 capsule by mouth daily., Disp: , Rfl:  .  estradiol (ESTRACE) 1 MG tablet, Take 1 mg by mouth daily., Disp: , Rfl: 2 .  Multiple Vitamin (MULTI VITAMIN DAILY PO), 1 tablet daily. , Disp: , Rfl:  .  oxybutynin (DITROPAN XL) 10 MG 24 hr tablet, Take 1 tablet by mouth daily., Disp: , Rfl:  .  progesterone (PROMETRIUM) 100 MG capsule, TAKE 1 CAPSULE EACH NIGHT AT BEDTIME, Disp: , Rfl: 1  Review of Systems  Constitutional: Positive for fatigue and fever. Negative for chills.  HENT: Positive for congestion, ear pain, postnasal drip, rhinorrhea, sinus pain, sinus pressure and sore throat. Negative for ear discharge, sneezing and trouble swallowing.   Respiratory: Negative for cough, chest tightness, shortness of breath and wheezing.   Cardiovascular: Negative for chest pain, palpitations and leg swelling.  Gastrointestinal: Negative for abdominal pain and nausea.  Neurological: Negative for dizziness and headaches.    Social History  Substance Use Topics  . Smoking status: Never Smoker  . Smokeless tobacco: Never Used  . Alcohol use No   Objective:   BP  100/70 (BP Location: Right Arm, Patient Position: Sitting, Cuff Size: Large)   Pulse 80   Temp 98.5 F (36.9 C) (Oral)   Resp 16   Wt 215 lb (97.5 kg)   SpO2 97%   BMI 34.70 kg/m   Physical Exam  Constitutional: She appears well-developed and well-nourished. No distress.  HENT:  Head: Normocephalic and atraumatic.  Right Ear: Hearing, tympanic membrane, external ear and ear canal normal.  Left Ear: Hearing, external ear and ear canal normal. Tympanic membrane is erythematous and bulging. A middle ear effusion is present.  Nose: Mucosal edema and rhinorrhea present. Right sinus exhibits maxillary sinus tenderness and frontal sinus tenderness. Left sinus exhibits maxillary sinus tenderness and frontal sinus tenderness.  Mouth/Throat: Uvula is midline, oropharynx is clear and moist and mucous membranes are normal. No oropharyngeal exudate, posterior oropharyngeal edema or posterior oropharyngeal erythema.  Eyes: Conjunctivae are normal. Pupils are equal, round, and reactive to light. Right eye exhibits no discharge. Left eye exhibits no discharge. No scleral icterus.  Neck: Normal range of motion. Neck supple. No tracheal deviation present. No thyromegaly present.  Cardiovascular: Normal rate, regular rhythm and normal heart sounds.  Exam reveals no gallop and no friction rub.   No murmur heard. Pulmonary/Chest: Effort normal and breath sounds normal. No stridor. No respiratory distress. She has no wheezes. She has no rales.  Lymphadenopathy:    She has no cervical adenopathy.  Skin: Skin is warm and dry. She is not diaphoretic.  Vitals reviewed.      Assessment & Plan:     1. Acute pansinusitis, recurrence not specified Worsening symptoms that have not responded to OTC medications. Will give augmentin as below. Continue allergy medications including flonase. May use mucinex for congestion. Stay well hydrated and get plenty of rest. Call if no symptom improvement or if symptoms  worsen. - amoxicillin-clavulanate (AUGMENTIN) 875-125 MG tablet; Take 1 tablet by mouth 2 (two) times daily.  Dispense: 14 tablet; Refill: 0  2. Acute suppurative otitis media of left ear without spontaneous rupture of tympanic membrane, recurrence not specified Worsening. Will treat with augmentin as below. She is to call the office if symptoms do not improve or if they worsen in the meantime.  - amoxicillin-clavulanate (AUGMENTIN) 875-125 MG tablet; Take 1 tablet by mouth 2 (two) times daily.  Dispense: 14 tablet; Refill: 0       Mar Daring, PA-C  [facility]

## 2016-03-11 ENCOUNTER — Telehealth: Payer: Self-pay | Admitting: Family Medicine

## 2016-03-11 NOTE — Telephone Encounter (Signed)
Called Pt to schedule AWV with [facility] - knb °

## 2016-08-22 ENCOUNTER — Telehealth: Payer: Self-pay | Admitting: Family Medicine

## 2016-08-22 NOTE — Telephone Encounter (Signed)
Called patient regarding her need for AWV w Aly[facility]

## 2016-09-28 ENCOUNTER — Telehealth: Payer: Self-pay | Admitting: Family Medicine

## 2016-10-07 ENCOUNTER — Ambulatory Visit: Payer: Medicare Other

## 2016-10-19 ENCOUNTER — Ambulatory Visit: Payer: Medicare Other

## 2016-11-07 NOTE — Telephone Encounter (Signed)
Appointment scheduled for later this month.  Will close note

## 2016-11-11 ENCOUNTER — Ambulatory Visit: Payer: Medicare Other

## 2016-11-24 ENCOUNTER — Encounter: Payer: Self-pay | Admitting: Family Medicine

## 2016-11-24 ENCOUNTER — Encounter: Payer: Self-pay | Admitting: Physician Assistant

## 2016-11-25 ENCOUNTER — Ambulatory Visit: Payer: Self-pay

## 2016-12-09 DIAGNOSIS — C4359 Malignant melanoma of other part of trunk: Secondary | ICD-10-CM | POA: Insufficient documentation

## 2017-11-07 ENCOUNTER — Ambulatory Visit: Payer: Medicare Other | Admitting: Podiatry

## 2018-06-17 ENCOUNTER — Encounter: Payer: Self-pay | Admitting: Family Medicine

## 2018-06-18 ENCOUNTER — Ambulatory Visit: Payer: Self-pay | Admitting: Physician Assistant

## 2018-06-18 NOTE — Progress Notes (Deleted)
**Note Anne-Identified via Obfuscation**        Patient: Anne Waters Female    DOB: [birth year]   63 y.o.   MRN: [id number] Visit Date: 06/18/2018  Today's Provider: Mar Daring, PA-C   No chief complaint on file.  Subjective:     Knee Pain      Allergies  Allergen Reactions  . Hydrocodone-Acetaminophen     Other reaction(s): VOMITING  . Sulfa Antibiotics      Current Outpatient Medications:  .  amoxicillin-clavulanate (AUGMENTIN) 875-125 MG tablet, Take 1 tablet by mouth 2 (two) times daily., Disp: 14 tablet, Rfl: 0 .  DULoxetine (CYMBALTA) 60 MG capsule, Take 1 capsule by mouth daily., Disp: , Rfl:  .  estradiol (ESTRACE) 1 MG tablet, Take 1 mg by mouth daily., Disp: , Rfl: 2 .  Multiple Vitamin (MULTI VITAMIN DAILY PO), 1 tablet daily. , Disp: , Rfl:  .  oxybutynin (DITROPAN XL) 10 MG 24 hr tablet, Take 1 tablet by mouth daily., Disp: , Rfl:  .  progesterone (PROMETRIUM) 100 MG capsule, TAKE 1 CAPSULE EACH NIGHT AT BEDTIME, Disp: , Rfl: 1  Review of Systems  Constitutional: Negative for appetite change, chills, fatigue and fever.  Respiratory: Negative for chest tightness and shortness of breath.   Cardiovascular: Negative for chest pain and palpitations.  Gastrointestinal: Negative for abdominal pain, nausea and vomiting.  Neurological: Negative for dizziness and weakness.    Social History   Tobacco Use  . Smoking status: Never Smoker  . Smokeless tobacco: Never Used  Substance Use Topics  . Alcohol use: No    Alcohol/week: 0.0 standard drinks      Objective:   There were no vitals taken for this visit. There were no vitals filed for this visit.   Physical Exam      Assessment & [place], PA-C  [facility]

## 2018-08-27 ENCOUNTER — Other Ambulatory Visit: Payer: Self-pay

## 2018-08-27 DIAGNOSIS — Z20822 Contact with and (suspected) exposure to covid-19: Secondary | ICD-10-CM

## 2018-08-30 ENCOUNTER — Telehealth: Payer: Self-pay

## 2018-08-30 LAB — NOVEL CORONAVIRUS, NAA: SARS-CoV-2, NAA: NOT DETECTED

## 2018-08-30 NOTE — Telephone Encounter (Signed)
LVMTRC 

## 2018-08-30 NOTE — Telephone Encounter (Signed)
Advsied

## 2018-08-30 NOTE — Telephone Encounter (Signed)
-----   Message from Jerrol Banana., MD sent at 08/30/2018  1:12 PM EDT ----- No covid.

## 2019-01-22 ENCOUNTER — Other Ambulatory Visit: Payer: Self-pay | Admitting: Obstetrics & Gynecology

## 2019-01-22 DIAGNOSIS — R928 Other abnormal and inconclusive findings on diagnostic imaging of breast: Secondary | ICD-10-CM

## 2019-01-30 ENCOUNTER — Other Ambulatory Visit: Payer: Medicare (Managed Care)

## 2019-02-11 ENCOUNTER — Other Ambulatory Visit: Payer: Self-pay | Admitting: Obstetrics & Gynecology

## 2019-02-11 ENCOUNTER — Ambulatory Visit
Admission: RE | Admit: 2019-02-11 | Discharge: 2019-02-11 | Disposition: A | Discharge status: Home / Self Care | Payer: Medicare PPO | Source: Ambulatory Visit | Attending: Obstetrics & Gynecology | Admitting: Obstetrics & Gynecology

## 2019-02-11 ENCOUNTER — Other Ambulatory Visit: Payer: Self-pay

## 2019-02-11 DIAGNOSIS — R928 Other abnormal and inconclusive findings on diagnostic imaging of breast: Secondary | ICD-10-CM

## 2019-02-11 DIAGNOSIS — R599 Enlarged lymph nodes, unspecified: Secondary | ICD-10-CM

## 2019-02-19 ENCOUNTER — Other Ambulatory Visit: Payer: Self-pay

## 2019-02-19 ENCOUNTER — Ambulatory Visit
Admission: RE | Admit: 2019-02-19 | Discharge: 2019-02-19 | Disposition: A | Discharge status: Home / Self Care | Payer: Medicare Other | Source: Ambulatory Visit | Attending: Obstetrics & Gynecology | Admitting: Obstetrics & Gynecology

## 2019-02-19 ENCOUNTER — Other Ambulatory Visit: Payer: Self-pay | Admitting: Obstetrics & Gynecology

## 2019-02-19 ENCOUNTER — Ambulatory Visit
Admission: RE | Admit: 2019-02-19 | Discharge: 2019-02-19 | Disposition: A | Discharge status: Home / Self Care | Payer: Medicare (Managed Care) | Source: Ambulatory Visit | Attending: Obstetrics & Gynecology | Admitting: Obstetrics & Gynecology

## 2019-02-19 DIAGNOSIS — R599 Enlarged lymph nodes, unspecified: Secondary | ICD-10-CM

## 2019-02-28 ENCOUNTER — Telehealth: Payer: Medicare PPO | Admitting: Physician Assistant

## 2019-02-28 DIAGNOSIS — J019 Acute sinusitis, unspecified: Secondary | ICD-10-CM

## 2019-02-28 MED ORDER — AMOXICILLIN-POT CLAVULANATE 875-125 MG PO TABS: 1.0000 | ORAL_TABLET | Freq: Two times a day (BID) | ORAL | 0 refills | Status: DC

## 2019-02-28 NOTE — Progress Notes (Signed)

## 2019-03-04 ENCOUNTER — Telehealth (HOSPITAL_COMMUNITY): Payer: Self-pay | Admitting: Nurse Practitioner

## 2019-03-04 ENCOUNTER — Other Ambulatory Visit (HOSPITAL_COMMUNITY): Payer: Self-pay | Admitting: Nurse Practitioner

## 2019-03-04 ENCOUNTER — Encounter: Payer: Self-pay | Admitting: Nurse Practitioner

## 2019-03-04 DIAGNOSIS — U071 COVID-19: Secondary | ICD-10-CM

## 2019-03-04 NOTE — Telephone Encounter (Signed)
Called patient to screen for MAB treatment. See orders encounter.

## 2019-03-04 NOTE — Telephone Encounter (Signed)
Called to Discuss with patient about Covid symptoms and the use of bamlanivimab, a monoclonal antibody infusion for those with mild to moderate Covid symptoms and at a high risk of hospitalization.     Called to evaluate patient for qualification for this infusion at the [facility] infusion center which is based on co-morbid conditions and/or a member of an at-risk group.     Unable to reach pt. Left message. Will send mychart message as well.

## 2019-03-04 NOTE — Progress Notes (Signed)
  I connected by phone with Anne Waters on 03/04/2019 at 6:37 PM to discuss the potential use of an new treatment for mild to moderate COVID-19 viral infection in non-hospitalized patients.  This patient is a 64 y.o. female that meets the FDA criteria for Emergency Use Authorization of bamlanivimab or casirivimab\imdevimab.  Has a (+) direct SARS-CoV-2 viral test result  Has mild or moderate COVID-19   Is ? 62 years of age and weighs ? 40 kg  Is NOT hospitalized due to COVID-19  Is NOT requiring oxygen therapy or requiring an increase in baseline oxygen flow rate due to COVID-19  Is within 10 days of symptom onset  Has at least one of the high risk factor(s) for progression to severe COVID-19 and/or hospitalization as defined in EUA.  Specific high risk criteria : BMI >/= 35  I have spoken and communicated the following to the patient or parent/caregiver:  1. FDA has authorized the emergency use of bamlanivimab and casirivimab\imdevimab for the treatment of mild to moderate COVID-19 in adults and pediatric patients with positive results of direct SARS-CoV-2 viral testing who are 62 years of age and older weighing at least 40 kg, and who are at high risk for progressing to severe COVID-19 and/or hospitalization.  2. The significant known and potential risks and benefits of bamlanivimab and casirivimab\imdevimab, and the extent to which such potential risks and benefits are unknown.  3. Information on available alternative treatments and the risks and benefits of those alternatives, including clinical trials.  4. Patients treated with bamlanivimab and casirivimab\imdevimab should continue to self-isolate and use infection control measures (e.g., wear mask, isolate, social distance, avoid sharing personal items, clean and disinfect "high touch" surfaces, and frequent handwashing) according to CDC guidelines.   5. The patient or parent/caregiver has the option to accept or refuse  bamlanivimab or casirivimab\imdevimab .  After reviewing this information with the patient, The patient agreed to proceed with receiving the bamlanimivab infusion and will be provided a copy of the Fact sheet prior to receiving the infusion.Anne Waters 03/04/2019 6:37 PM

## 2019-03-07 ENCOUNTER — Ambulatory Visit (HOSPITAL_COMMUNITY)
Admission: RE | Admit: 2019-03-07 | Discharge: 2019-03-07 | Disposition: A | Discharge status: Home / Self Care | Payer: Medicare Other | Source: Ambulatory Visit | Attending: Pulmonary Disease | Admitting: Pulmonary Disease

## 2019-03-07 DIAGNOSIS — U071 COVID-19: Secondary | ICD-10-CM | POA: Diagnosis present

## 2019-03-07 DIAGNOSIS — Z23 Encounter for immunization: Secondary | ICD-10-CM | POA: Insufficient documentation

## 2019-03-07 MED ORDER — EPINEPHRINE 0.3 MG/0.3ML IJ SOAJ: 0.3000 mg | Freq: Once | INTRAMUSCULAR | Status: DC | PRN

## 2019-03-07 MED ORDER — FAMOTIDINE IN NACL 20-0.9 MG/50ML-% IV SOLN: 20.0000 mg | Freq: Once | INTRAVENOUS | Status: DC | PRN

## 2019-03-07 MED ORDER — SODIUM CHLORIDE 0.9 % IV SOLN
INTRAVENOUS | Status: DC | PRN
  Administered 2019-03-07: 09:00:00 250 mL via INTRAVENOUS

## 2019-03-07 MED ORDER — SODIUM CHLORIDE 0.9 % IV SOLN
700.0000 mg | Freq: Once | INTRAVENOUS | Status: AC
  Administered 2019-03-07: 700 mg via INTRAVENOUS
  Filled 2019-03-07: qty 20

## 2019-03-07 MED ORDER — METHYLPREDNISOLONE SODIUM SUCC 125 MG IJ SOLR: 125.0000 mg | Freq: Once | INTRAMUSCULAR | Status: DC | PRN

## 2019-03-07 MED ORDER — DIPHENHYDRAMINE HCL 50 MG/ML IJ SOLN: 50.0000 mg | Freq: Once | INTRAMUSCULAR | Status: DC | PRN

## 2019-03-07 MED ORDER — ALBUTEROL SULFATE HFA 108 (90 BASE) MCG/ACT IN AERS: 2.0000 | INHALATION_SPRAY | Freq: Once | RESPIRATORY_TRACT | Status: DC | PRN

## 2019-03-07 NOTE — Discharge Instructions (Signed)
10 Things You Can Do to Manage Your COVID-19 Symptoms at Home If you have possible or confirmed COVID-19: 1. Stay home from work and school. And stay away from other public places. If you must go out, avoid using any kind of public transportation, ridesharing, or taxis. 2. Monitor your symptoms carefully. If your symptoms get worse, call your healthcare provider immediately. 3. Get rest and stay hydrated. 4. If you have a medical appointment, call the healthcare provider ahead of time and tell them that you have or may have COVID-19. 5. For medical emergencies, call 911 and notify the dispatch personnel that you have or may have COVID-19. 6. Cover your cough and sneezes with a tissue or use the inside of your elbow. 7. Wash your hands often with soap and water for at least 20 seconds or clean your hands with an alcohol-based hand sanitizer that contains at least 60% alcohol. 8. As much as possible, stay in a specific room and away from other people in your home. Also, you should use a separate bathroom, if available. If you need to be around other people in or outside of the home, wear a mask. 9. Avoid sharing personal items with other people in your household, like dishes, towels, and bedding. 10. Clean all surfaces that are touched often, like counters, tabletops, and doorknobs. Use household cleaning sprays or wipes according to the label instructions. [email or web address] 08/08/2018 This information is not intended to replace advice given to you by your health care provider. Make sure you discuss any questions you have with your health care provider. Document Revised: 01/10/2019 Document Reviewed: 01/10/2019 Elsevier Patient Education  [place] Can Do to Manage Your COVID-19 Symptoms at Home If you have possible or confirmed COVID-19: 11. Stay home from work and school. And stay away from other public places. If you must go out, avoid using any kind of public  transportation, ridesharing, or taxis. 12. Monitor your symptoms carefully. If your symptoms get worse, call your healthcare provider immediately. 13. Get rest and stay hydrated. 14. If you have a medical appointment, call the healthcare provider ahead of time and tell them that you have or may have COVID-19. 15. For medical emergencies, call 911 and notify the dispatch personnel that you have or may have COVID-19. 16. Cover your cough and sneezes with a tissue or use the inside of your elbow. 29. Wash your hands often with soap and water for at least 20 seconds or clean your hands with an alcohol-based hand sanitizer that contains at least 60% alcohol. 18. As much as possible, stay in a specific room and away from other people in your home. Also, you should use a separate bathroom, if available. If you need to be around other people in or outside of the home, wear a mask. 19. Avoid sharing personal items with other people in your household, like dishes, towels, and bedding. 20. Clean all surfaces that are touched often, like counters, tabletops, and doorknobs. Use household cleaning sprays or wipes according to the label instructions. [email or web address] 08/08/2018 This information is not intended to replace advice given to you by your health care provider. Make sure you discuss any questions you have with your health care provider. Document Revised: 01/10/2019 Document Reviewed: 01/10/2019 Elsevier Patient Education  [place]. What types of side effects do monoclonal antibody drugs cause?  Common side effects  In general, the more common side effects caused by monoclonal antibody drugs include: . Allergic reactions,  such as hives or itching . Flu-like signs and symptoms, including chills, fatigue, fever, and muscle aches and pains . Nausea, vomiting . Diarrhea . Skin rashes . Low blood pressure  What types of side effects do monoclonal antibody drugs cause?  Common side  effects  In general, the more common side effects caused by monoclonal antibody drugs include: . Allergic reactions, such as hives or itching . Flu-like signs and symptoms, including chills, fatigue, fever, and muscle aches and pains . Nausea, vomiting . Diarrhea . Skin rashes . Low blood pressure   The CDC is recommending patients who receive monoclonal antibody treatments wait at least 90 days before being vaccinated.  Currently, there are no data on the safety and efficacy of mRNA COVID-19 vaccines in persons who received monoclonal antibodies or convalescent plasma as part of COVID-19 treatment. Based on the estimated half-life of such therapies as well as evidence suggesting that reinfection is uncommon in the 90 days after initial infection, vaccination should be deferred for at least 90 days, as a precautionary measure until additional information becomes available, to avoid interference of the antibody treatment with vaccine-induced immune responses.   The CDC is recommending patients who receive monoclonal antibody treatments wait at least 90 days before being vaccinated.  Currently, there are no data on the safety and efficacy of mRNA COVID-19 vaccines in persons who received monoclonal antibodies or convalescent plasma as part of COVID-19 treatment. Based on the estimated half-life of such therapies as well as evidence suggesting that reinfection is uncommon in the 90 days after initial infection, vaccination should be deferred for at least 90 days, as a precautionary measure until additional information becomes available, to avoid interference of the antibody treatment with vaccine-induced immune responses.

## 2019-03-07 NOTE — Progress Notes (Signed)
  Diagnosis: COVID-19  Physician:dr wright   Procedure: [facility] Infusion [facility] Med: bamlanivimab infusion - Provided patient with bamlanimivab fact sheet for patients, parents and caregivers prior to infusion.  Complications: No immediate complications noted.  Discharge: Discharged home   [place], [address] 03/07/2019

## 2019-03-08 ENCOUNTER — Ambulatory Visit (HOSPITAL_COMMUNITY): Payer: Medicare (Managed Care)

## 2019-06-26 NOTE — Progress Notes (Signed)
MyChart Video Visit   I,April Miller,acting as a scribe for Wilhemena Durie, MD.,have documented all relevant documentation on the behalf of Wilhemena Durie, MD,as directed by  Wilhemena Durie, MD while in the presence of Wilhemena Durie, MD.   Virtual Visit via Video Note   This visit type was conducted due to national recommendations for restrictions regarding the COVID-19 Pandemic (e.g. social distancing) in an effort to limit this patient's exposure and mitigate transmission in our community. This patient is at least at moderate risk for complications without adequate follow up. This format is felt to be most appropriate for this patient at this time. Physical exam was limited by quality of the video and audio technology used for the visit.   Patient location: Home Provider location: Office.   Patient: Anne Waters   DOB: 01/04/1956   64 y.o. Female  MRN: [id number] Visit Date: 06/27/2019  Today's healthcare provider: Wilhemena Durie, MD   No chief complaint on file.  Subjective    HPI Patient wants to discuss leg pain. Patient also wants to discuss her medication (duloxetine) for Anxiety and PTSD.   She has been seen by Dr. Nicolasa Ducking in the past but she is no longer on her insurance.  She requests refill on Cymbalta 60 mg daily while she get set up with someone else.  She also has developed pain in her right lateral and posterior thigh and her right calf.  It is mainly after sitting or driving.  It is also aggravated when she rolls over on her side during the night and is there for a while.  No calf pain when she walks.  No weakness in the leg.  No trauma and no joint pain. She is open that tibia diet and has lost 16 pounds in the last couple of months.  She is pleased with this. Past Medical History:  Diagnosis Date  . Allergy   . Depression       Medications: Outpatient Medications Prior to Visit  Medication Sig  . estradiol (ESTRACE) 2 MG tablet   .  Multiple Vitamin (MULTI VITAMIN DAILY PO) 1 tablet daily.   . progesterone (PROMETRIUM) 100 MG capsule TAKE 1 CAPSULE EACH NIGHT AT BEDTIME  . tolterodine (DETROL LA) 4 MG 24 hr capsule   . amoxicillin-clavulanate (AUGMENTIN) 875-125 MG tablet Take 1 tablet by mouth 2 (two) times daily. One po bid x 7 days (Patient not taking: Reported on 06/27/2019)  . DULoxetine (CYMBALTA) 60 MG capsule Take 1 capsule by mouth daily.  Marland Kitchen estradiol (ESTRACE) 1 MG tablet Take 1 mg by mouth daily.  Marland Kitchen oxybutynin (DITROPAN XL) 10 MG 24 hr tablet Take 1 tablet by mouth daily.   No facility-administered medications prior to visit.    Review of Systems  Constitutional: Negative for appetite change, chills, fatigue and fever.  Respiratory: Negative for chest tightness and shortness of breath.   Cardiovascular: Negative for chest pain and palpitations.  Gastrointestinal: Negative for abdominal pain, nausea and vomiting.  Neurological: Negative for dizziness and weakness.       Objective    There were no vitals taken for this visit. BP Readings from Last 3 Encounters:  03/07/19 104/61  02/10/16 100/70  02/11/15 132/80   Wt Readings from Last 3 Encounters:  02/10/16 215 lb (97.5 kg)  02/11/15 228 lb 9.6 oz (103.7 kg)  01/28/15 230 lb 12.8 oz (104.7 kg)      Physical Exam  Assessment & Plan     1. Depression, major, single episode, complete remission ([place]) See her back in 1 to 2 months. - DULoxetine (CYMBALTA) 60 MG capsule; Take 1 capsule (60 mg total) by mouth daily.  Dispense: 90 capsule; Refill: 0  2. Alcohol use disorder, moderate, in sustained remission, dependence ([place]) Patient has been sober, will find out length of time when I see her.  3. Leg pain, diffuse, right I think this is probably degenerative disc disease .  Try meloxicam for a month and see her back.  Also recommended yoga or Pilates in addition to regular walking. - meloxicam (MOBIC) 15 MG tablet; Take 1 tablet (15 mg  total) by mouth daily.  Dispense: 30 tablet; Refill: 3    No follow-ups on file.     I discussed the assessment and treatment plan with the patient. The patient was provided an opportunity to ask questions and all were answered. The patient agreed with the plan and demonstrated an understanding of the instructions.   The patient was advised to call back or seek an in-person evaluation if the symptoms worsen or if the condition fails to improve as anticipated.  I provided 12 minutes of non-face-to-face time during this encounter.    Reizy Dunlow Cranford Mon, MD [facility] [phone] (phone) [phone] (fax)  [place]

## 2019-06-27 ENCOUNTER — Telehealth: Payer: Self-pay

## 2019-06-27 ENCOUNTER — Telehealth (INDEPENDENT_AMBULATORY_CARE_PROVIDER_SITE_OTHER): Payer: Medicare PPO | Admitting: Family Medicine

## 2019-06-27 DIAGNOSIS — M79604 Pain in right leg: Secondary | ICD-10-CM | POA: Diagnosis not present

## 2019-06-27 DIAGNOSIS — F325 Major depressive disorder, single episode, in full remission: Secondary | ICD-10-CM

## 2019-06-27 DIAGNOSIS — F1021 Alcohol dependence, in remission: Secondary | ICD-10-CM | POA: Diagnosis not present

## 2019-06-27 MED ORDER — MELOXICAM 15 MG PO TABS: 15.0000 mg | ORAL_TABLET | Freq: Every day | ORAL | 3 refills | Status: DC

## 2019-06-27 MED ORDER — DULOXETINE HCL 60 MG PO CPEP: 60.0000 mg | ORAL_CAPSULE | Freq: Every day | ORAL | 0 refills | Status: DC

## 2019-06-27 NOTE — Telephone Encounter (Signed)
Called to schedule patient for a 1-2 month f/u. LVMTCB.

## 2019-08-08 DIAGNOSIS — D0371 Melanoma in situ of right lower limb, including hip: Secondary | ICD-10-CM | POA: Diagnosis not present

## 2019-08-13 DIAGNOSIS — N950 Postmenopausal bleeding: Secondary | ICD-10-CM | POA: Diagnosis not present

## 2019-08-16 DIAGNOSIS — L089 Local infection of the skin and subcutaneous tissue, unspecified: Secondary | ICD-10-CM | POA: Diagnosis not present

## 2019-08-22 DIAGNOSIS — D485 Neoplasm of uncertain behavior of skin: Secondary | ICD-10-CM | POA: Diagnosis not present

## 2019-08-22 DIAGNOSIS — D2361 Other benign neoplasm of skin of right upper limb, including shoulder: Secondary | ICD-10-CM | POA: Diagnosis not present

## 2019-08-26 NOTE — Progress Notes (Signed)
Established patient visit  I,April Miller,acting as a scribe for Wilhemena Durie, MD.,have documented all relevant documentation on the behalf of Wilhemena Durie, MD,as directed by  Wilhemena Durie, MD while in the presence of Wilhemena Durie, MD.   Patient: Anne Waters   DOB: 02-09-1955   64 y.o. Female  MRN: [id number] Visit Date: 08/27/2019  Today's healthcare provider: Wilhemena Durie, MD   Chief Complaint  Patient presents with  . Depression  . Follow-up   Subjective    HPI  Patient comes in today for follow-up.  She is stressed out because she is caring for her 64 year old mother.  She is seeing Dr. Nicolasa Ducking from psychiatry.  She is doing well on Cymbalta. Patient doing well.  She has now been sober for 9 years.  She is doing great with that. She has never had a colonoscopy and declines one but states she is willing to do a Cologuard which she did for 5 years ago. She sees Dr. Dory Horn from gynecology for her well woman exam. HPI    Leg Pain   Last edited by Julieta Bellini, [place] on 08/27/2019 12:59 PM. (History)      Depression, Follow-up  She  was last seen for this 2 months ago. Changes made at last visit include; no changes were made. She reports good compliance with treatment. She is not having side effects.  She reports excellent tolerance of treatment. Current symptoms include: none She feels she is Improved since last visit.  No flowsheet data found.  -------------------------------------------------------------------  Alcohol use disorder, moderate, in sustained remission, dependence ([place]) From 06/27/2019-Patient has been sober, will find out length of time when I see her.  Leg pain, diffuse, right From 06/27/2019-I think this is probably degenerative disc disease .  Try meloxicam 15 mg for a month and see her back.  Also recommended yoga or Pilates in addition to regular walking.  Patient states she did not start meloxicam. She did  lost has lost weight, which has relieved the pain she was having.      Medications: Outpatient Medications Prior to Visit  Medication Sig  . DULoxetine (CYMBALTA) 60 MG capsule Take 1 capsule (60 mg total) by mouth daily.  Marland Kitchen estradiol (ESTRACE) 1 MG tablet Take 1 mg by mouth daily.  . Loratadine (CLARITIN PO) Take by mouth.  . Multiple Vitamin (MULTI VITAMIN DAILY PO) 1 tablet daily.   . multivitamin-lutein (OCUVITE-LUTEIN) CAPS capsule Take 1 capsule by mouth daily.  . progesterone (PROMETRIUM) 100 MG capsule TAKE 1 CAPSULE EACH NIGHT AT BEDTIME  . tolterodine (DETROL LA) 4 MG 24 hr capsule   . amoxicillin-clavulanate (AUGMENTIN) 875-125 MG tablet Take 1 tablet by mouth 2 (two) times daily. One po bid x 7 days (Patient not taking: Reported on 06/27/2019)  . estradiol (ESTRACE) 2 MG tablet  (Patient not taking: Reported on 08/27/2019)  . meloxicam (MOBIC) 15 MG tablet Take 1 tablet (15 mg total) by mouth daily. (Patient not taking: Reported on 08/27/2019)  . oxybutynin (DITROPAN XL) 10 MG 24 hr tablet Take 1 tablet by mouth daily. (Patient not taking: Reported on 08/27/2019)   No facility-administered medications prior to visit.    Review of Systems  Constitutional: Negative for appetite change, chills, fatigue and fever.  Respiratory: Negative for chest tightness and shortness of breath.   Cardiovascular: Negative for chest pain and palpitations.  Gastrointestinal: Negative for abdominal pain, nausea and vomiting.  Neurological: Negative for  dizziness and weakness.       Objective    BP (!) 143/91 (BP Location: Left Arm, Patient Position: Sitting, Cuff Size: Large)   Pulse 81   Temp (!) 97.5 F (36.4 C) (Other (Comment))   Resp 16   Ht 5\' 6"  (1.676 m)   Wt 209 lb (94.8 kg)   SpO2 97%   BMI 33.73 kg/m  Wt Readings from Last 3 Encounters:  08/27/19 209 lb (94.8 kg)  02/10/16 215 lb (97.5 kg)  02/11/15 228 lb 9.6 oz (103.7 kg)      Physical Exam Vitals reviewed.  HENT:      Head: Normocephalic and atraumatic.     Right Ear: External ear normal.     Left Ear: External ear normal.  Eyes:     General: No scleral icterus.    Conjunctiva/sclera: Conjunctivae normal.  Cardiovascular:     Rate and Rhythm: Normal rate and regular rhythm.     Pulses: Normal pulses.     Heart sounds: Normal heart sounds.  Pulmonary:     Effort: Pulmonary effort is normal.     Breath sounds: Normal breath sounds.  Abdominal:     Palpations: Abdomen is soft.  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  Skin:    General: Skin is warm and dry.  Neurological:     General: No focal deficit present.     Mental Status: She is alert and oriented to person, place, and time.  Psychiatric:        Mood and Affect: Mood normal.        Behavior: Behavior normal.        Thought Content: Thought content normal.        Judgment: Judgment normal.     BP (!) 143/91 (BP Location: Left Arm, Patient Position: Sitting, Cuff Size: Large)   Pulse 81   Temp (!) 97.5 F (36.4 C) (Other (Comment))   Resp 16   Ht 5\' 6"  (1.676 m)   Wt 209 lb (94.8 kg)   SpO2 97%   BMI 33.73 kg/m   General Appearance:    Alert, cooperative, no distress, appears stated age  Head:    Normocephalic, without obvious abnormality, atraumatic  Eyes:    PERRL, conjunctiva/corneas clear, EOM's intact, fundi    benign, both eyes  Ears:    Normal TM's and external ear canals, both ears  Nose:   Nares normal, septum midline, mucosa normal, no drainage    or sinus tenderness  Throat:   Lips, mucosa, and tongue normal; teeth and gums normal  Neck:   Supple, symmetrical, trachea midline, no adenopathy;    thyroid:  no enlargement/tenderness/nodules; no carotid   bruit or JVD  Back:     Symmetric, no curvature, ROM normal, no CVA tenderness  Lungs:     Clear to auscultation bilaterally, respirations unlabored  Chest Wall:    No tenderness or deformity   Heart:     Regular rate and rhythm, S1 and S2 normal, no murmur, rub   or gallop  Breast Exam:    No tenderness, masses, or nipple abnormality  Abdomen:     Soft, non-tender, bowel sounds active all four quadrants,    no masses, no organomegaly  Genitalia:    Normal female without lesion, discharge or tenderness  Rectal:    Normal tone, normal prostate, no masses or tenderness;   guaiac negative stool  Extremities:  Extremities normal, atraumatic, no cyanosis or edema  Pulses:   2+ and symmetric all extremities  Skin:   Skin color, texture, turgor normal, no rashes or lesions  Lymph nodes:   Cervical, supraclavicular, and axillary nodes normal  Neurologic:   CNII-XII intact, normal strength, sensation and reflexes    throughout     No results found for any visits on 08/27/19.  Assessment & Plan     1. Leg pain, diffuse, right Markedly improved.  2. Depression, major, single episode, complete remission ([place]) Followed by psychiatry.  Clinically stable.  3. Screening for colon cancer Schedule for complete physical in next 6 months. - Cologuard   Return in about 6 months (around 02/27/2020).         Lendon George Cranford Mon, MD  [facility] [phone] (phone) [phone] (fax)  [place]

## 2019-08-27 ENCOUNTER — Encounter: Payer: Self-pay | Admitting: Family Medicine

## 2019-08-27 ENCOUNTER — Other Ambulatory Visit: Payer: Self-pay

## 2019-08-27 ENCOUNTER — Ambulatory Visit (INDEPENDENT_AMBULATORY_CARE_PROVIDER_SITE_OTHER): Payer: Medicare PPO | Admitting: Family Medicine

## 2019-08-27 VITALS — BP 143/91 | HR 81 | Temp 97.5°F | Resp 16 | Ht 66.0 in | Wt 209.0 lb

## 2019-08-27 DIAGNOSIS — G4733 Obstructive sleep apnea (adult) (pediatric): Secondary | ICD-10-CM | POA: Diagnosis not present

## 2019-08-27 DIAGNOSIS — M79604 Pain in right leg: Secondary | ICD-10-CM | POA: Diagnosis not present

## 2019-08-27 DIAGNOSIS — Z1211 Encounter for screening for malignant neoplasm of colon: Secondary | ICD-10-CM | POA: Diagnosis not present

## 2019-08-27 DIAGNOSIS — F325 Major depressive disorder, single episode, in full remission: Secondary | ICD-10-CM | POA: Diagnosis not present

## 2019-09-03 DIAGNOSIS — Z1211 Encounter for screening for malignant neoplasm of colon: Secondary | ICD-10-CM | POA: Diagnosis not present

## 2019-09-04 LAB — COLOGUARD: Cologuard: NEGATIVE

## 2019-09-06 LAB — EXTERNAL GENERIC LAB PROCEDURE: COLOGUARD: NEGATIVE

## 2019-09-06 LAB — COLOGUARD: COLOGUARD: NEGATIVE

## 2019-09-18 ENCOUNTER — Other Ambulatory Visit: Payer: Self-pay | Admitting: Family Medicine

## 2019-09-18 DIAGNOSIS — F325 Major depressive disorder, single episode, in full remission: Secondary | ICD-10-CM

## 2019-09-18 NOTE — Telephone Encounter (Signed)
Requested Prescriptions  Pending Prescriptions Disp Refills  . DULoxetine (CYMBALTA) 60 MG capsule [Pharmacy Med Name: DULOXETINE HCL DR 60 MG CAP] 90 capsule 1    Sig: TAKE 1 CAPSULE BY MOUTH EVERY DAY     Psychiatry: Antidepressants - SNRI Failed - 09/18/2019  1:57 AM      Failed - Last BP in normal range    BP Readings from Last 1 Encounters:  08/27/19 (!) 143/91         Passed - Valid encounter within last 6 months    Recent Outpatient Visits          3 weeks ago Leg pain, diffuse, right   [facility] Jerrol Banana., MD   2 months ago Depression, major, single episode, complete remission [facility])   [facility] Jerrol Banana., MD   3 years ago Acute pansinusitis, recurrence not specified   [facility] [place], [place], [place]   4 years ago [facility], [place], [place]   4 years ago OSA on CPAP   [facility] [place], [place], [place]

## 2020-02-04 ENCOUNTER — Telehealth: Payer: Self-pay

## 2020-02-04 NOTE — Telephone Encounter (Signed)
Copied from CRM [id number]. Topic: Appointment Scheduling - Scheduling Inquiry for Clinic >> Feb 04, 2020 10:11 AM Marylen Ponto wrote: Reason for CRM: Pt called to cancel Medicare AWV appt scheduled for 03/05/20. Attempted to schedule next available appt but pt declined after being advised of the appt notes that were listed. Pt requests call back

## 2020-02-12 ENCOUNTER — Telehealth: Payer: Medicare PPO | Admitting: Nurse Practitioner

## 2020-02-12 DIAGNOSIS — B9789 Other viral agents as the cause of diseases classified elsewhere: Secondary | ICD-10-CM | POA: Diagnosis not present

## 2020-02-12 DIAGNOSIS — J329 Chronic sinusitis, unspecified: Secondary | ICD-10-CM

## 2020-02-12 MED ORDER — FLUTICASONE PROPIONATE 50 MCG/ACT NA SUSP: 2.0000 | Freq: Every day | NASAL | 6 refills | Status: AC

## 2020-02-12 NOTE — Progress Notes (Signed)
We are sorry that you are not feeling well.  Here is how we plan to help!  Based on what you have shared with me it looks like you have sinusitis.  Sinusitis is inflammation and infection in the sinus cavities of the head.  Based on your presentation I believe you most likely have Acute Viral Sinusitis.This is an infection most likely caused by a virus. There is not specific treatment for viral sinusitis other than to help you with the symptoms until the infection runs its course.  You may use an oral decongestant such as Mucinex D or if you have glaucoma or high blood pressure use plain Mucinex. Saline nasal spray help and can safely be used as often as needed for congestion, I have prescribed: Fluticasone nasal spray two sprays in each nostril once a day    You do not met the criteria for an antibiotic at this time. Providers prescribe antibiotics to treat infections caused by bacteria. Antibiotics are very powerful in treating bacterial infections when they are used properly. To maintain their effectiveness, they should be used only when necessary. Overuse of antibiotics has resulted in the development of superbugs that are resistant to treatment!    After careful review of your answers, I would not recommend an antibiotic for your condition.  Antibiotics are not effective against viruses and therefore should not be used to treat them. Common examples of infections caused by viruses include colds and flu    Some authorities believe that zinc sprays or the use of Echinacea may shorten the course of your symptoms.  Sinus infections are not as easily transmitted as other respiratory infection, however we still recommend that you avoid close contact with loved ones, especially the very young and elderly.  Remember to wash your hands thotitioner to complete your personal care plan.  Depending on the condition, your plan could have included both over the counter or prescription medications.  If there is  a problem please reply  once you have received a response from your provider.  Your safety is important to [place].  If you have drug allergies check your prescription carefully.    You can use MyChart to ask questions about today's visit, request a non-urgent call back, or ask for a work or school excuse for 24 hours related to this e-Visit. If it has been greater than 24 hours you will need to follow up with your provider, or enter a new e-Visit to address those concerns.  You will get an e-mail in the next two days asking about your experience.  I hope that your e-visit has been valuable and will speed your recovery. Thank you for using e-visits.roughly throughout the day as this is the number one way to prevent the spread of infection!  Home Care:  Only take medications as instructed by your medical team.  Do not take these medications with alcohol.  A steam or ultrasonic humidifier can help congestion.  You can place a towel over your head and breathe in the steam from hot water coming from a faucet.  Avoid close contacts especially the very young and the elderly.  Cover your mouth when you cough or sneeze.  Always remember to wash your hands.  Get Help Right Away If:  You develop worsening fever or sinus pain.  You develop a severe head ache or visual changes.  Your symptoms persist after you have completed your treatment plan.  Make sure you  Understand these instructions.  Will watch  your condition.  Will get help right away if you are not doing well or get worse.  Your e-visit answers were reviewed by a board certified advanced clinical prac  5-10 minutes spent reviewing and documenting in chart.

## 2020-02-17 ENCOUNTER — Telehealth: Payer: Medicare PPO | Admitting: Family

## 2020-02-17 DIAGNOSIS — J019 Acute sinusitis, unspecified: Secondary | ICD-10-CM

## 2020-02-17 MED ORDER — AMOXICILLIN-POT CLAVULANATE 875-125 MG PO TABS: 1.0000 | ORAL_TABLET | Freq: Two times a day (BID) | ORAL | 0 refills | Status: DC

## 2020-02-17 NOTE — Progress Notes (Signed)

## 2020-02-25 DIAGNOSIS — G4733 Obstructive sleep apnea (adult) (pediatric): Secondary | ICD-10-CM | POA: Diagnosis not present

## 2020-02-28 DIAGNOSIS — D225 Melanocytic nevi of trunk: Secondary | ICD-10-CM | POA: Diagnosis not present

## 2020-02-28 DIAGNOSIS — Z872 Personal history of diseases of the skin and subcutaneous tissue: Secondary | ICD-10-CM | POA: Diagnosis not present

## 2020-02-28 DIAGNOSIS — D2271 Melanocytic nevi of right lower limb, including hip: Secondary | ICD-10-CM | POA: Diagnosis not present

## 2020-02-28 DIAGNOSIS — Z85820 Personal history of malignant melanoma of skin: Secondary | ICD-10-CM | POA: Diagnosis not present

## 2020-02-28 DIAGNOSIS — L821 Other seborrheic keratosis: Secondary | ICD-10-CM | POA: Diagnosis not present

## 2020-02-28 DIAGNOSIS — D2261 Melanocytic nevi of right upper limb, including shoulder: Secondary | ICD-10-CM | POA: Diagnosis not present

## 2020-02-28 DIAGNOSIS — Z85828 Personal history of other malignant neoplasm of skin: Secondary | ICD-10-CM | POA: Diagnosis not present

## 2020-03-04 NOTE — Telephone Encounter (Signed)
LMTCB to answer any questions pt has about the AWV and schedule apt. Direct # left to CB on [phone]).

## 2020-03-05 ENCOUNTER — Ambulatory Visit: Payer: Self-pay | Admitting: Family Medicine

## 2020-03-05 NOTE — Telephone Encounter (Signed)
Pt r/c and LM stating she would like to schedule her telephonic AWV.  Tried to reach pt and was unsuccessful. LM for pt to r/c. Advised her there was availability next Thursday, 03/12/20.

## 2020-03-19 ENCOUNTER — Other Ambulatory Visit: Payer: Self-pay | Admitting: Family Medicine

## 2020-03-19 DIAGNOSIS — F325 Major depressive disorder, single episode, in full remission: Secondary | ICD-10-CM

## 2020-03-24 NOTE — Telephone Encounter (Signed)
Scheduled telephonic AWV for 04/01/20 @ 10:20 AM.

## 2020-03-31 NOTE — Progress Notes (Signed)
Subjective:   Anne Waters is a 65 y.o. female who presents for an Initial Medicare Annual Wellness Visit.  I connected with Amika Tassin today by telephone and verified that I am speaking with the correct person using two identifiers. Location patient: home Location provider: work Persons participating in the virtual visit: patient, provider.   I discussed the limitations, risks, security and privacy concerns of performing an evaluation and management service by telephone and the availability of in person appointments. I also discussed with the patient that there may be a patient responsible charge related to this service. The patient expressed understanding and verbally consented to this telephonic visit.    Interactive audio and video telecommunications were attempted between this provider and patient, however failed, due to patient having technical difficulties OR patient did not have access to video capability.  We continued and completed visit with audio only.   Review of Systems    N/A  Cardiac Risk Factors include: obesity (BMI >30kg/m2)     Objective:    There were no vitals filed for this visit. There is no height or weight on file to calculate BMI.  Advanced Directives 04/01/2020  Does Patient Have a Medical Advance Directive? Yes  Type of Paramedic of [place];Living will  Copy of [place] in Chart? No - copy requested    Current Medications (verified) Outpatient Encounter Medications as of 04/01/2020  Medication Sig  . DULoxetine (CYMBALTA) 60 MG capsule TAKE 1 CAPSULE BY MOUTH EVERY DAY  . estradiol (ESTRACE) 1 MG tablet Take 1 mg by mouth daily.  . fluticasone (FLONASE) 50 MCG/ACT nasal spray Place 2 sprays into both nostrils daily.  . Loratadine (CLARITIN PO) Take 10 mg by mouth daily.  . Multiple Vitamin (MULTI VITAMIN DAILY PO) 1 tablet daily.   . multivitamin-lutein (OCUVITE-LUTEIN) CAPS capsule Take 1  capsule by mouth daily.  . Probiotic Product (PROBIOTIC DAILY PO) Take by mouth daily.  . progesterone (PROMETRIUM) 100 MG capsule TAKE 1 CAPSULE EACH NIGHT AT BEDTIME  . tolterodine (DETROL LA) 4 MG 24 hr capsule Take 4 mg by mouth daily.  Marland Kitchen amoxicillin-clavulanate (AUGMENTIN) 875-125 MG tablet Take 1 tablet by mouth 2 (two) times daily. (Patient not taking: Reported on 04/01/2020)  . estradiol (ESTRACE) 2 MG tablet  (Patient not taking: No sig reported)  . meloxicam (MOBIC) 15 MG tablet Take 1 tablet (15 mg total) by mouth daily. (Patient not taking: Reported on 04/01/2020)  . oxybutynin (DITROPAN-XL) 10 MG 24 hr tablet Take 1 tablet by mouth daily. (Patient not taking: No sig reported)   No facility-administered encounter medications on file as of 04/01/2020.    Allergies (verified) Hydrocodone-acetaminophen and Sulfa antibiotics   History: Past Medical History:  Diagnosis Date  . Allergy   . Depression    Past Surgical History:  Procedure Laterality Date  . arm and hand surgery Bilateral    2010 left; 2011 right   Family History  Problem Relation Age of Onset  . Cataracts Mother   . Arthritis Mother   . Macular degeneration Mother   . Heart attack Maternal Grandmother   . Hypertension Maternal Grandmother   . Hypertension Maternal Grandfather   . CVA Maternal Grandfather   . Alzheimer's disease Maternal Grandfather   . CVA Paternal Grandmother   . Alzheimer's disease Paternal Grandmother   . Hypertension Paternal Grandmother   . Heart attack Paternal Grandfather   . Healthy Daughter   . Healthy Daughter  Social History   Socioeconomic History  . Marital status: Married    Spouse name: Not on file  . Number of children: 2  . Years of education: Not on file  . Highest education level: Master's degree (e.g., MA, MS, MEng, MEd, MSW, MBA)  Occupational History  . Not on file  Tobacco Use  . Smoking status: Never Smoker  . Smokeless tobacco: Never Used  Vaping Use   . Vaping Use: Never used  Substance and Sexual Activity  . Alcohol use: No    Alcohol/week: 0.0 standard drinks    Comment: Previous heavy drinker- currently in AA  . Drug use: No  . Sexual activity: Not on file  Other Topics Concern  . Not on file  Social History Narrative  . Not on file   Social Determinants of Health   Financial Resource Strain: Low Risk   . Difficulty of Paying Living Expenses: Not hard at all  Food Insecurity: No Food Insecurity  . Worried About Charity fundraiser in the Last Year: Never true  . Ran Out of Food in the Last Year: Never true  Transportation Needs: No Transportation Needs  . Lack of Transportation (Medical): No  . Lack of Transportation (Non-Medical): No  Physical Activity: Insufficiently Active  . Days of Exercise per Week: 4 days  . Minutes of Exercise per Session: 30 min  Stress: Stress Concern Present  . Feeling of Stress : Rather much  Social Connections: Socially Integrated  . Frequency of Communication with Friends and Family: More than three times a week  . Frequency of Social Gatherings with Friends and Family: More than three times a week  . Attends Religious Services: More than 4 times per year  . Active Member of Clubs or Organizations: Yes  . Attends Archivist Meetings: More than 4 times per year  . Marital Status: Married    Tobacco Counseling Counseling given: Not Answered   Clinical Intake:  Pre-visit preparation completed: Yes  Pain : No/denies pain     Nutritional Risks: None Diabetes: No  How often do you need to have someone help you when you read instructions, pamphlets, or other written materials from your doctor or pharmacy?: 1 - Never  Diabetic? No  Interpreter Needed?: No  Information entered by :: [facility], LPN   Activities of Daily Living In your present state of health, do you have any difficulty performing the following activities: 04/01/2020  Hearing? N  Vision? N   Difficulty concentrating or making decisions? N  Walking or climbing stairs? N  Dressing or bathing? N  Doing errands, shopping? N  Preparing Food and eating ? N  Using the Toilet? N  In the past six months, have you accidently leaked urine? Y  Comment Currently on Detrol daily.  Do you have problems with loss of bowel control? N  Managing your Medications? N  Managing your Finances? N  Housekeeping or managing your Housekeeping? N  Some recent data might be hidden    Patient Care Team: Jerrol Banana., MD as PCP - General (Family Medicine) Oneta Rack, MD (Dermatology) Maisie Fus, MD as Attending Physician (Obstetrics and Gynecology) [place], [place] any recent Medical Services you may have received from other than [facility] providers in the past year (date may be approximate).     Assessment:   This is a routine wellness examination for [place].  Hearing/Vision screen No exam data present  Dietary issues  and exercise activities discussed: Current Exercise Habits: Home exercise routine, Type of exercise: walking, Time (Minutes): 40, Frequency (Times/Week): 4, Weekly Exercise (Minutes/Week): 160, Intensity: Mild, Exercise limited by: None identified  Goals    . DIET - REDUCE CALORIE INTAKE     Continue current diet plan of cutting back on fried foods, sugar and continue to eat 5 small meals a day.       Depression Screen PHQ 2/9 Scores 04/01/2020  PHQ - 2 Score 0    Fall Risk Fall Risk  04/01/2020  Falls in the past year? 0  Number falls in past yr: 0  Injury with Fall? 0    FALL RISK PREVENTION PERTAINING TO THE HOME:  Any stairs in or around the home? Yes  If so, are there any without handrails? No  Home free of loose throw rugs in walkways, pet beds, electrical cords, etc? Yes  Adequate lighting in your home to reduce risk of falls? Yes   ASSISTIVE DEVICES UTILIZED TO PREVENT FALLS:  Life alert? No  Use of a cane, walker  or w/c? No  Grab bars in the bathroom? No  Shower chair or bench in shower? Yes  Elevated toilet seat or a handicapped toilet? No    Cognitive Function:      6CIT Screen 04/01/2020  What Year? 0 points  What month? 0 points  What time? 0 points  Count back from 20 0 points  Months in reverse 0 points  Repeat phrase 0 points  Total Score 0    Immunizations Immunization History  Administered Date(s) Administered  . Influenza,inj,Quad PF,6+ Mos 12/08/2019  . Influenza-Unspecified 12/07/2014, 12/15/2016, 12/06/2017  . PFIZER(Purple Top)SARS-COV-2 Vaccination 06/24/2019, 07/15/2019    TDAP status: Due, Education has been provided regarding the importance of this vaccine. Advised may receive this vaccine at local pharmacy or Health Dept. Aware to provide a copy of the vaccination record if obtained from local pharmacy or Health Dept. Verbalized acceptance and understanding.  Flu Vaccine status: Up to date  Covid-19 vaccine status: Completed vaccines  Qualifies for Shingles Vaccine? Yes   Zostavax completed No   Shingrix Completed?: No.    Education has been provided regarding the importance of this vaccine. Patient has been advised to call insurance company to determine out of pocket expense if they have not yet received this vaccine. Advised may also receive vaccine at local pharmacy or Health Dept. Verbalized acceptance and understanding.  Screening Tests Health Maintenance  Topic Date Due  . Hepatitis C Screening  Never done  . HIV Screening  Never done  . MAMMOGRAM  Never done  . PAP SMEAR-Modifier  10/05/2017  . COVID-19 Vaccine (3 - Pfizer risk 4-dose series) 08/12/2019  . TETANUS/TDAP  04/01/2021 (Originally [birth year]01/1974)  . Fecal DNA (Cologuard)  09/03/2022  . INFLUENZA VACCINE  Completed    Health Maintenance  Health Maintenance Due  Topic Date Due  . Hepatitis C Screening  Never done  . HIV Screening  Never done  . MAMMOGRAM  Never done  . PAP SMEAR-Modifier   10/05/2017  . COVID-19 Vaccine (3 - Pfizer risk 4-dose series) 08/12/2019    Colorectal cancer screening: Type of screening: Cologuard. Completed 09/03/19. Repeat every 3 years  Mammogram status: Completed 04/2019. Repeat every year   Lung Cancer Screening: (Low Dose CT Chest recommended if Age 22-80 years, 30 pack-year currently smoking OR have quit w/in 15years.) does not qualify.    Additional Screening:  Hepatitis C Screening: does qualify; however  declines order at this time.   Vision Screening: Recommended annual ophthalmology exams for early detection of glaucoma and other disorders of the eye. Is the patient up to date with their annual eye exam?  Yes  Who is the provider or what is the name of the office in which the patient attends annual eye exams? Dr Glennon Mac @ [facility] If pt is not established with a provider, would they like to be referred to a provider to establish care? No .   Dental Screening: Recommended annual dental exams for proper oral hygiene  Community Resource Referral / Chronic Care Management: CRR required this visit?  No   CCM required this visit?  No      Plan:     I have personally reviewed and noted the following in the patient's chart:   . Medical and social history . Use of alcohol, tobacco or illicit drugs  . Current medications and supplements . Functional ability and status . Nutritional status . Physical activity . Advanced directives . List of other physicians . Hospitalizations, surgeries, and ER visits in previous 12 months . Vitals . Screenings to include cognitive, depression, and falls . Referrals and appointments  In addition, I have reviewed and discussed with patient certain preventive protocols, quality metrics, and best practice recommendations. A written personalized care plan for preventive services as well as general preventive health recommendations were provided to patient.     Rocky Gladden [place], [place]   [id number]    Nurse Notes: Pt has her mammogram and pap smear scheduled for 04/09/20. Pt declined a Covid booster and Hep C lab order at this time.

## 2020-04-01 ENCOUNTER — Ambulatory Visit (INDEPENDENT_AMBULATORY_CARE_PROVIDER_SITE_OTHER): Payer: Medicare PPO

## 2020-04-01 ENCOUNTER — Other Ambulatory Visit: Payer: Self-pay

## 2020-04-01 DIAGNOSIS — Z0000 Encounter for general adult medical examination without abnormal findings: Secondary | ICD-10-CM

## 2020-04-01 NOTE — Patient Instructions (Signed)
Anne Waters , Thank you for taking time to come for your Medicare Wellness Visit. I appreciate your ongoing commitment to your health goals. Please review the following plan we discussed and let me know if I can assist you in the future.   Screening recommendations/referrals: Colonoscopy: Cologuard up to date, due 09/03/22 Mammogram: Up to date, scheduled for 04/09/20. Recommended yearly ophthalmology/optometry visit for glaucoma screening and checkup Recommended yearly dental visit for hygiene and checkup  Vaccinations: Influenza vaccine: Done 11/2019 Tdap vaccine: Currently due, declined receiving. Shingles vaccine: Shingrix discussed. Please contact your pharmacy for coverage information.     Advanced directives: Please bring a copy of your POA (Power of Attorney) and/or Living Will to your next appointment.   Conditions/risks identified: Continue current diet plan of cutting back on fried foods, sugar and continue to eat 5 small meals a day.   Next appointment: 07/08/20 @ 10:20 AM with Dr Sullivan Lone   Preventive Care 40-64 Years, Female Preventive care refers to lifestyle choices and visits with your health care provider that can promote health and wellness. What does preventive care include?  A yearly physical exam. This is also called an annual well check.  Dental exams once or twice a year.  Routine eye exams. Ask your health care provider how often you should have your eyes checked.  Personal lifestyle choices, including:  Daily care of your teeth and gums.  Regular physical activity.  Eating a healthy diet.  Avoiding tobacco and drug use.  Limiting alcohol use.  Practicing safe sex.  Taking low-dose aspirin daily starting at age 33.  Taking vitamin and mineral supplements as recommended by your health care provider. What happens during an annual well check? The services and screenings done by your health care provider during your annual well check will depend on your  age, overall health, lifestyle risk factors, and family history of disease. Counseling  Your health care provider may ask you questions about your:  Alcohol use.  Tobacco use.  Drug use.  Emotional well-being.  Home and relationship well-being.  Sexual activity.  Eating habits.  Work and work Astronomer.  Method of birth control.  Menstrual cycle.  Pregnancy history. Screening  You may have the following tests or measurements:  Height, weight, and BMI.  Blood pressure.  Lipid and cholesterol levels. These may be checked every 5 years, or more frequently if you are over 15 years old.  Skin check.  Lung cancer screening. You may have this screening every year starting at age 47 if you have a 30-pack-year history of smoking and currently smoke or have quit within the past 15 years.  Fecal occult blood test (FOBT) of the stool. You may have this test every year starting at age 4.  Flexible sigmoidoscopy or colonoscopy. You may have a sigmoidoscopy every 5 years or a colonoscopy every 10 years starting at age 63.  Hepatitis C blood test.  Hepatitis B blood test.  Sexually transmitted disease (STD) testing.  Diabetes screening. This is done by checking your blood sugar (glucose) after you have not eaten for a while (fasting). You may have this done every 1-3 years.  Mammogram. This may be done every 1-2 years. Talk to your health care provider about when you should start having regular mammograms. This may depend on whether you have a family history of breast cancer.  BRCA-related cancer screening. This may be done if you have a family history of breast, ovarian, tubal, or peritoneal cancers.  Pelvic exam  and Pap test. This may be done every 3 years starting at age 61. Starting at age 74, this may be done every 5 years if you have a Pap test in combination with an HPV test.  Bone density scan. This is done to screen for osteoporosis. You may have this scan if you  are at high risk for osteoporosis. Discuss your test results, treatment options, and if necessary, the need for more tests with your health care provider. Vaccines  Your health care provider may recommend certain vaccines, such as:  Influenza vaccine. This is recommended every year.  Tetanus, diphtheria, and acellular pertussis (Tdap, Td) vaccine. You may need a Td booster every 10 years.  Zoster vaccine. You may need this after age 54.  Pneumococcal 13-valent conjugate (PCV13) vaccine. You may need this if you have certain conditions and were not previously vaccinated.  Pneumococcal polysaccharide (PPSV23) vaccine. You may need one or two doses if you smoke cigarettes or if you have certain conditions. Talk to your health care provider about which screenings and vaccines you need and how often you need them. This information is not intended to replace advice given to you by your health care provider. Make sure you discuss any questions you have with your health care provider. Document Released: 02/20/2015 Document Revised: 10/14/2015 Document Reviewed: 11/25/2014 Elsevier Interactive Patient Education  2017 [employer] Prevention in the Home Falls can cause injuries. They can happen to people of all ages. There are many things you can do to make your home safe and to help prevent falls. What can I do on the outside of my home?  Regularly fix the edges of walkways and driveways and fix any cracks.  Remove anything that might make you trip as you walk through a door, such as a raised step or threshold.  Trim any bushes or trees on the path to your home.  Use bright outdoor lighting.  Clear any walking paths of anything that might make someone trip, such as rocks or tools.  Regularly check to see if handrails are loose or broken. Make sure that both sides of any steps have handrails.  Any raised decks and porches should have guardrails on the edges.  Have any leaves,  snow, or ice cleared regularly.  Use sand or salt on walking paths during winter.  Clean up any spills in your garage right away. This includes oil or grease spills. What can I do in the bathroom?  Use night lights.  Install grab bars by the toilet and in the tub and shower. Do not use towel bars as grab bars.  Use non-skid mats or decals in the tub or shower.  If you need to sit down in the shower, use a plastic, non-slip stool.  Keep the floor dry. Clean up any water that spills on the floor as soon as it happens.  Remove soap buildup in the tub or shower regularly.  Attach bath mats securely with double-sided non-slip rug tape.  Do not have throw rugs and other things on the floor that can make you trip. What can I do in the bedroom?  Use night lights.  Make sure that you have a light by your bed that is easy to reach.  Do not use any sheets or blankets that are too big for your bed. They should not hang down onto the floor.  Have a firm chair that has side arms. You can use this for support while  you get dressed.  Do not have throw rugs and other things on the floor that can make you trip. What can I do in the kitchen?  Clean up any spills right away.  Avoid walking on wet floors.  Keep items that you use a lot in easy-to-reach places.  If you need to reach something above you, use a strong step stool that has a grab bar.  Keep electrical cords out of the way.  Do not use floor polish or wax that makes floors slippery. If you must use wax, use non-skid floor wax.  Do not have throw rugs and other things on the floor that can make you trip. What can I do with my stairs?  Do not leave any items on the stairs.  Make sure that there are handrails on both sides of the stairs and use them. Fix handrails that are broken or loose. Make sure that handrails are as long as the stairways.  Check any carpeting to make sure that it is firmly attached to the stairs. Fix any  carpet that is loose or worn.  Avoid having throw rugs at the top or bottom of the stairs. If you do have throw rugs, attach them to the floor with carpet tape.  Make sure that you have a light switch at the top of the stairs and the bottom of the stairs. If you do not have them, ask someone to add them for you. What else can I do to help prevent falls?  Wear shoes that:  Do not have high heels.  Have rubber bottoms.  Are comfortable and fit you well.  Are closed at the toe. Do not wear sandals.  If you use a stepladder:  Make sure that it is fully opened. Do not climb a closed stepladder.  Make sure that both sides of the stepladder are locked into place.  Ask someone to hold it for you, if possible.  Clearly mark and make sure that you can see:  Any grab bars or handrails.  First and last steps.  Where the edge of each step is.  Use tools that help you move around (mobility aids) if they are needed. These include:  Canes.  Walkers.  Scooters.  Crutches.  Turn on the lights when you go into a dark area. Replace any light bulbs as soon as they burn out.  Set up your furniture so you have a clear path. Avoid moving your furniture around.  If any of your floors are uneven, fix them.  If there are any pets around you, be aware of where they are.  Review your medicines with your doctor. Some medicines can make you feel dizzy. This can increase your chance of falling. Ask your doctor what other things that you can do to help prevent falls. This information is not intended to replace advice given to you by your health care provider. Make sure you discuss any questions you have with your health care provider. Document Released: 11/20/2008 Document Revised: 07/02/2015 Document Reviewed: 02/28/2014 Elsevier Interactive Patient Education  2017 [employer].

## 2020-04-09 DIAGNOSIS — Z1231 Encounter for screening mammogram for malignant neoplasm of breast: Secondary | ICD-10-CM | POA: Diagnosis not present

## 2020-04-09 DIAGNOSIS — N950 Postmenopausal bleeding: Secondary | ICD-10-CM | POA: Diagnosis not present

## 2020-04-09 DIAGNOSIS — Z6837 Body mass index (BMI) 37.0-37.9, adult: Secondary | ICD-10-CM | POA: Diagnosis not present

## 2020-04-09 DIAGNOSIS — Z124 Encounter for screening for malignant neoplasm of cervix: Secondary | ICD-10-CM | POA: Diagnosis not present

## 2020-04-09 DIAGNOSIS — Z01419 Encounter for gynecological examination (general) (routine) without abnormal findings: Secondary | ICD-10-CM | POA: Diagnosis not present

## 2020-04-16 ENCOUNTER — Other Ambulatory Visit: Payer: Self-pay | Admitting: Family Medicine

## 2020-04-16 DIAGNOSIS — F325 Major depressive disorder, single episode, in full remission: Secondary | ICD-10-CM

## 2020-04-16 NOTE — Telephone Encounter (Signed)
   Notes to clinic: Patient requesting 90 day supply    Requested Prescriptions  Pending Prescriptions Disp Refills   DULoxetine (CYMBALTA) 60 MG capsule [Pharmacy Med Name: DULOXETINE HCL DR 60 MG CAP] 90 capsule 1    Sig: TAKE 1 CAPSULE BY MOUTH EVERY DAY      Psychiatry: Antidepressants - SNRI Failed - 04/16/2020  1:30 PM      Failed - Last BP in normal range    BP Readings from Last 1 Encounters:  08/27/19 (!) 143/91          Failed - Valid encounter within last 6 months    Recent Outpatient Visits           7 months ago Leg pain, diffuse, right   [facility] Jerrol Banana., MD   9 months ago Depression, major, single episode, complete remission [facility])   [facility] Jerrol Banana., MD   4 years ago Acute pansinusitis, recurrence not specified   [facility] [place], [place], [place]   5 years ago [facility], [place], [place]   5 years ago OSA on CPAP   [facility] [place], [place], [place]       Future Appointments             In 2 months Jerrol Banana., MD [facility], [place]

## 2020-04-17 LAB — HM PAP SMEAR: HM Pap smear: NORMAL

## 2020-05-26 DIAGNOSIS — G4733 Obstructive sleep apnea (adult) (pediatric): Secondary | ICD-10-CM | POA: Diagnosis not present

## 2020-07-08 ENCOUNTER — Encounter: Payer: Medicare PPO | Admitting: Family Medicine

## 2020-07-16 ENCOUNTER — Other Ambulatory Visit: Payer: Self-pay | Admitting: Family Medicine

## 2020-07-16 DIAGNOSIS — F325 Major depressive disorder, single episode, in full remission: Secondary | ICD-10-CM

## 2020-07-16 NOTE — Telephone Encounter (Signed)
Requested Prescriptions  Pending Prescriptions Disp Refills  . DULoxetine (CYMBALTA) 60 MG capsule [Pharmacy Med Name: DULOXETINE HCL DR 60 MG CAP] 90 capsule 0    Sig: TAKE 1 CAPSULE BY MOUTH EVERY DAY     Psychiatry: Antidepressants - SNRI Failed - 07/16/2020  1:44 AM      Failed - Last BP in normal range    BP Readings from Last 1 Encounters:  08/27/19 (!) 143/91         Failed - Valid encounter within last 6 months    Recent Outpatient Visits          10 months ago Leg pain, diffuse, right   [facility] Jerrol Banana., MD   1 year ago Depression, major, single episode, complete remission [facility])   [facility] Jerrol Banana., MD   4 years ago Acute pansinusitis, recurrence not specified   [facility] [place], [place], [place]   5 years ago [facility], [place], [place]   5 years ago OSA on CPAP   [facility] [place], [place], [place]      Future Appointments            In 2 months Jerrol Banana., MD [facility], [place]

## 2020-07-29 ENCOUNTER — Telehealth: Payer: Self-pay

## 2020-07-29 NOTE — Telephone Encounter (Signed)
Copied from [place] [id number]. Topic: General - Other >> Jul 29, 2020  9:09 AM Yvette Rack wrote: Reason for CRM: Pt stated she would like to get a travel c-pap machine but it requires a Rx. Pt requests Rx for travel c-pap machine be faxed to [facility] @ fax# [phone] or e-mailed to [email or web address]. Pt stated she was told it could be the old Rx as the company can only sale the machine with a Rx. Pt requests call back once the Rx has been faxed or e-mailed. Cb# [phone]

## 2020-07-29 NOTE — Telephone Encounter (Signed)
Ok to write Rx? Please advise.

## 2020-07-29 NOTE — Telephone Encounter (Signed)
Patient reports that she has an appointment scheduled with Dr. Rosanna Randy 10/05/20. Reports that she has a cpap machine already and that she only needs the script for the travel c-pap to be fax before her appointment.

## 2020-07-29 NOTE — Telephone Encounter (Signed)
Rx written and waiting for signature.

## 2020-08-13 NOTE — Telephone Encounter (Signed)
Pt advised that requested order was faxed to [facility]. -Nichole

## 2020-08-23 ENCOUNTER — Telehealth: Payer: Medicare PPO | Admitting: Orthopedic Surgery

## 2020-08-23 DIAGNOSIS — R0989 Other specified symptoms and signs involving the circulatory and respiratory systems: Secondary | ICD-10-CM | POA: Diagnosis not present

## 2020-08-23 MED ORDER — AMOXICILLIN-POT CLAVULANATE 875-125 MG PO TABS: 1.0000 | ORAL_TABLET | Freq: Two times a day (BID) | ORAL | 0 refills | Status: AC

## 2020-08-23 NOTE — Progress Notes (Signed)
E-Visit for Sinus Problems  We are sorry that you are not feeling well.  Here is how we plan to help!  Based on what you have shared with me it looks like you have sinusitis.  Sinusitis is inflammation and infection in the sinus cavities of the head.  Based on your presentation I believe you most likely have Acute Bacterial Sinusitis.  This is an infection caused by bacteria and is treated with antibiotics. I have prescribed Augmentin 875mg /125mg  one tablet twice daily with food, for 7 days. You may use an oral decongestant such as Mucinex D or if you have glaucoma or high blood pressure use plain Mucinex. Saline nasal spray help and can safely be used as often as needed for congestion.  If you develop worsening sinus pain, fever or notice severe headache and vision changes, or if symptoms are not better after completion of antibiotic, please schedule an appointment with a health care provider.    Sinus infections are not as easily transmitted as other respiratory infection, however we still recommend that you avoid close contact with loved ones, especially the very young and elderly.  Remember to wash your hands thoroughly throughout the day as this is the number one way to prevent the spread of infection!  Home Care: Only take medications as instructed by your medical team. Complete the entire course of an antibiotic. Do not take these medications with alcohol. A steam or ultrasonic humidifier can help congestion.  You can place a towel over your head and breathe in the steam from hot water coming from a faucet. Avoid close contacts especially the very young and the elderly. Cover your mouth when you cough or sneeze. Always remember to wash your hands.  Get Help Right Away If: You develop worsening fever or sinus pain. You develop a severe head ache or visual changes. Your symptoms persist after you have completed your treatment plan.  Make sure you Understand these instructions. Will watch  your condition. Will get help right away if you are not doing well or get worse.  Thank you for choosing an e-visit.  Your e-visit answers were reviewed by a board certified advanced clinical practitioner to complete your personal care plan. Depending upon the condition, your plan could have included both over the counter or prescription medications.  Please review your pharmacy choice. Make sure the pharmacy is open so you can pick up prescription now. If there is a problem, you may contact your provider through [employer] and have the prescription routed to another pharmacy.  Your safety is important to [place]. If you have drug allergies check your prescription carefully.   For the next 24 hours you can use MyChart to ask questions about today's visit, request a non-urgent call back, or ask for a work or school excuse. You will get an email in the next two days asking about your experience. I hope that your e-visit has been valuable and will speed your recovery.  Greater than 5 minutes, yet less than 10 minutes of time have been spent researching, coordinating and implementing care for this patient today.

## 2020-09-02 DIAGNOSIS — X32XXXA Exposure to sunlight, initial encounter: Secondary | ICD-10-CM | POA: Diagnosis not present

## 2020-09-02 DIAGNOSIS — L821 Other seborrheic keratosis: Secondary | ICD-10-CM | POA: Diagnosis not present

## 2020-09-02 DIAGNOSIS — L570 Actinic keratosis: Secondary | ICD-10-CM | POA: Diagnosis not present

## 2020-09-02 DIAGNOSIS — Z09 Encounter for follow-up examination after completed treatment for conditions other than malignant neoplasm: Secondary | ICD-10-CM | POA: Diagnosis not present

## 2020-09-02 DIAGNOSIS — Z872 Personal history of diseases of the skin and subcutaneous tissue: Secondary | ICD-10-CM | POA: Diagnosis not present

## 2020-09-02 DIAGNOSIS — Z85820 Personal history of malignant melanoma of skin: Secondary | ICD-10-CM | POA: Diagnosis not present

## 2020-09-02 DIAGNOSIS — Z85828 Personal history of other malignant neoplasm of skin: Secondary | ICD-10-CM | POA: Diagnosis not present

## 2020-09-02 DIAGNOSIS — D225 Melanocytic nevi of trunk: Secondary | ICD-10-CM | POA: Diagnosis not present

## 2020-09-29 IMAGING — MG MM BREAST LOCALIZATION CLIP
4 series · 4 of 12 positions shown · non-contrast
Comparison: Previous exam(s).

CLINICAL DATA: Post biopsy mammogram of the left breast for clip
placement.

EXAM:
DIAGNOSTIC LEFT MAMMOGRAM POST ULTRASOUND BIOPSY

[L MLO synth-2D (1 of 2)]
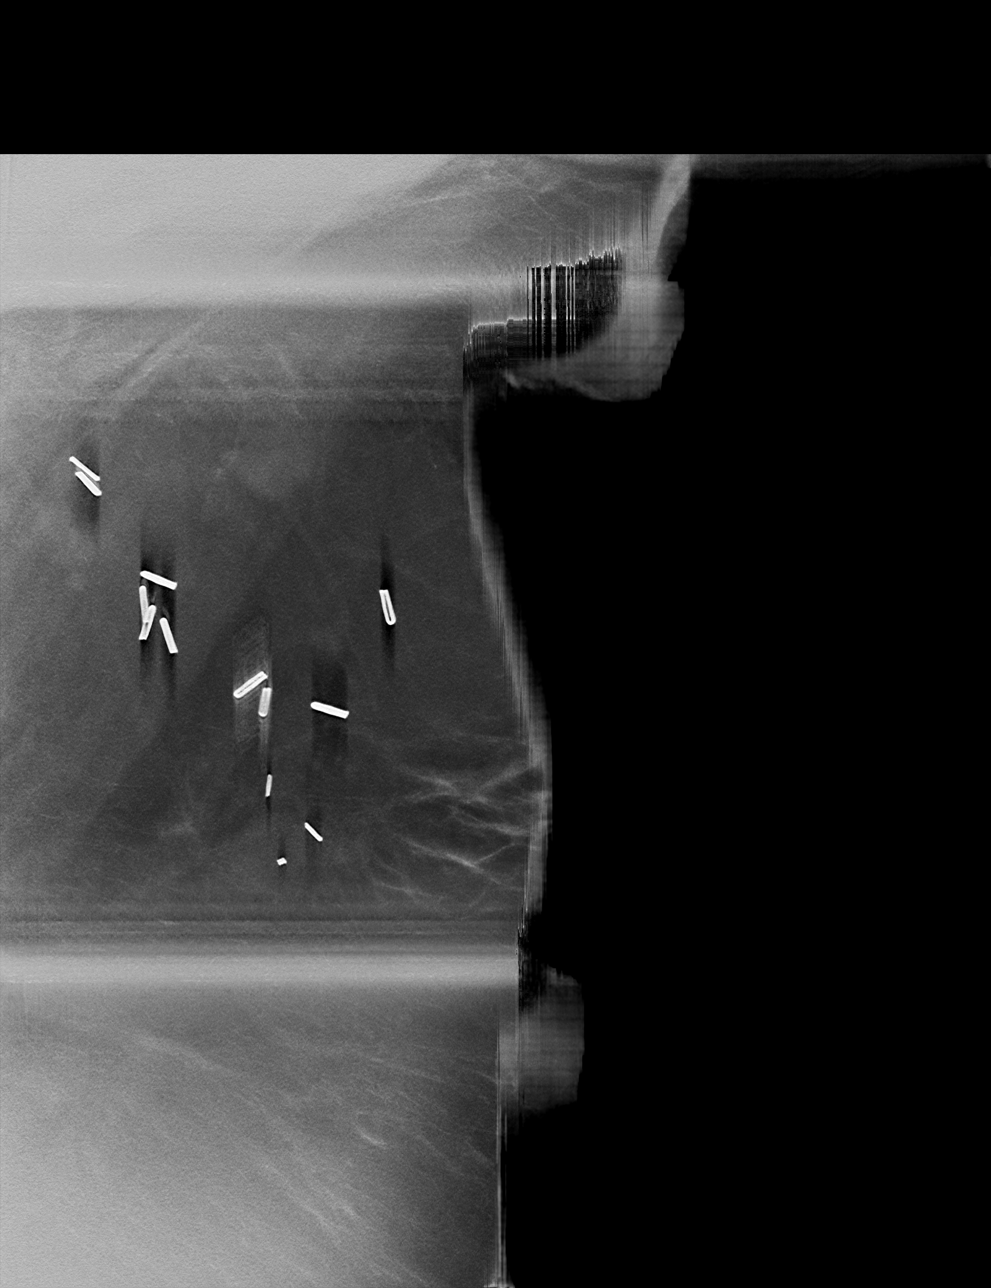

[L MLO synth-2D (2 of 2)]
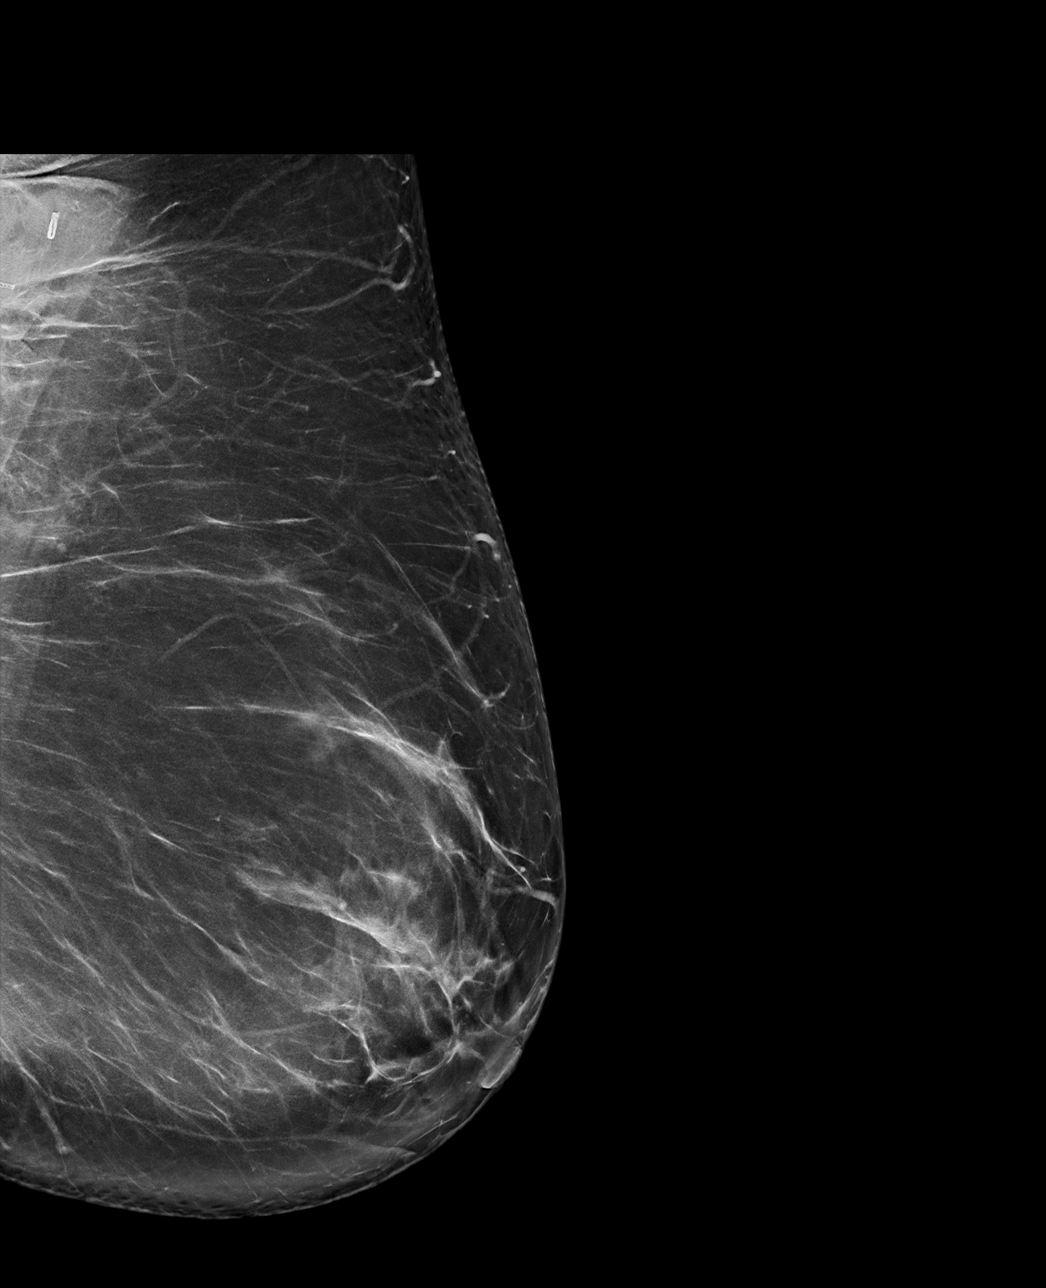

[L MLO tomo (1 of 2) · tomo slice 47/94.0]
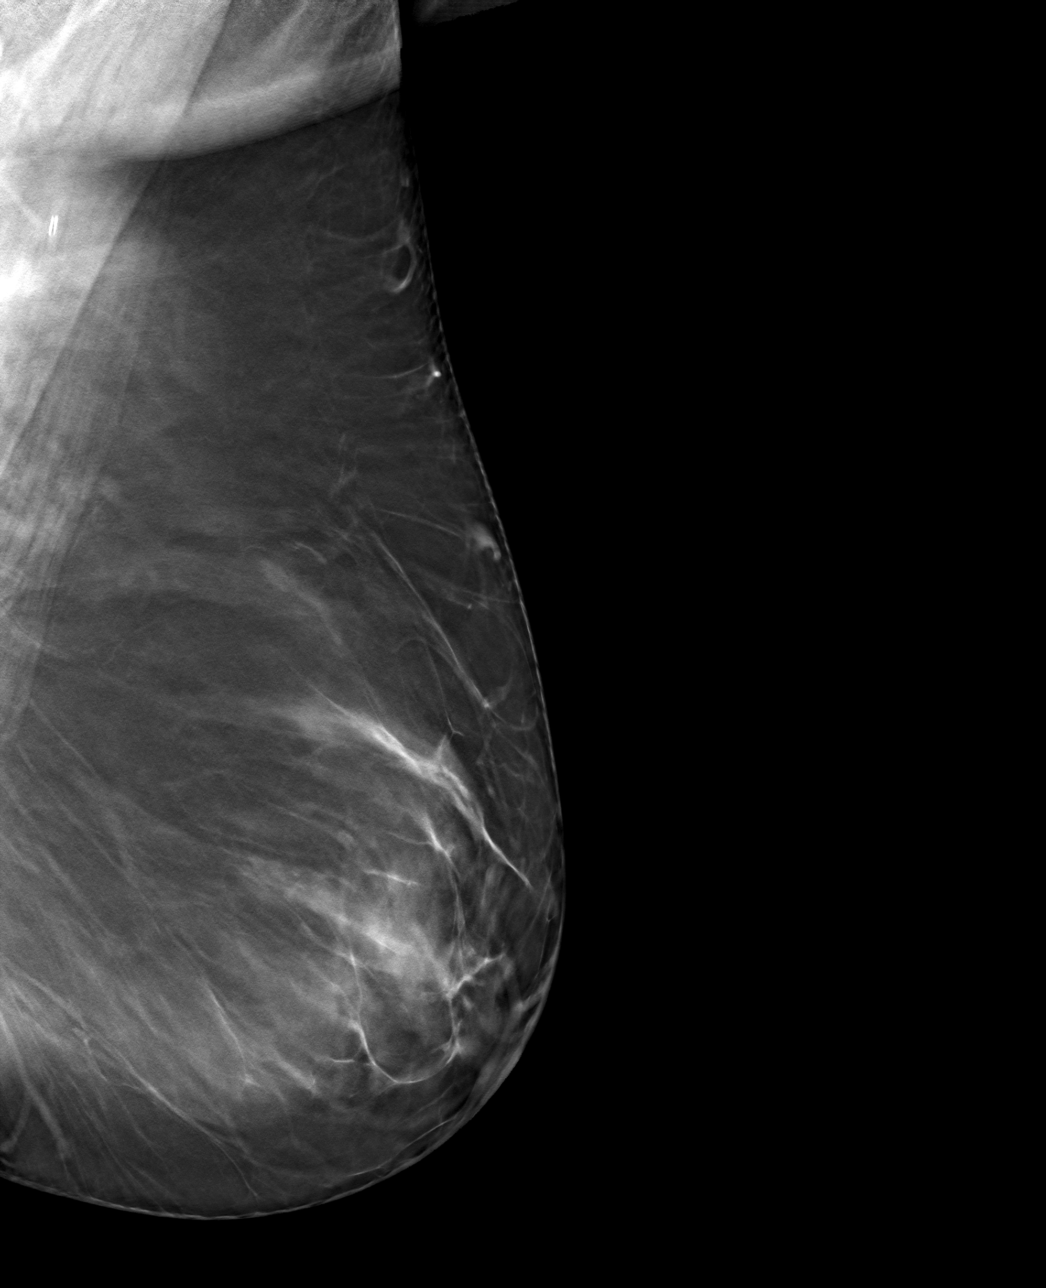

[L MLO tomo (2 of 2) · tomo slice 61/121.0]
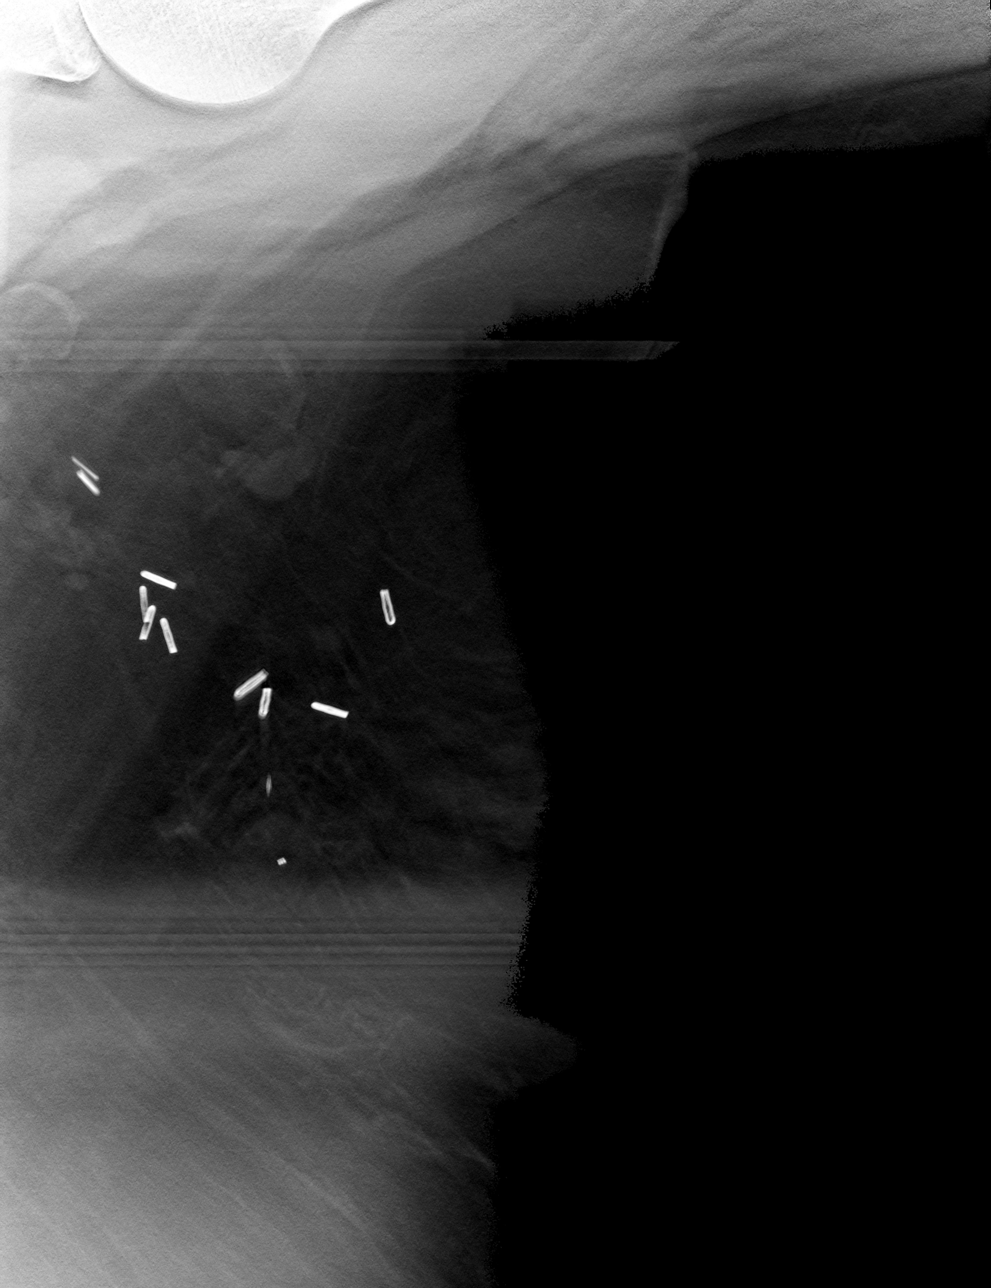

[4 of 12 positions shown; findings below may reference images not displayed]

FINDINGS: Mammographic images were obtained following ultrasound guided biopsy
of left axillary lymph node. The biopsy marking clip is in expected
position at the site of biopsy. The clip is seen within the lymph
node of concern in the left axilla.
IMPRESSION: Appropriate positioning of the HydroMARK spiral shaped biopsy
marking clip at the site of biopsy in the left axilla.

Final Assessment: Post Procedure Mammograms for Marker Placement

## 2020-10-05 ENCOUNTER — Other Ambulatory Visit: Payer: Self-pay

## 2020-10-05 ENCOUNTER — Encounter: Payer: Self-pay | Admitting: Family Medicine

## 2020-10-05 ENCOUNTER — Ambulatory Visit (INDEPENDENT_AMBULATORY_CARE_PROVIDER_SITE_OTHER): Payer: Medicare PPO | Admitting: Family Medicine

## 2020-10-05 VITALS — BP 133/74 | HR 79 | Resp 16 | Ht 66.0 in | Wt 227.0 lb

## 2020-10-05 DIAGNOSIS — Z0000 Encounter for general adult medical examination without abnormal findings: Secondary | ICD-10-CM

## 2020-10-05 DIAGNOSIS — F325 Major depressive disorder, single episode, in full remission: Secondary | ICD-10-CM

## 2020-10-05 DIAGNOSIS — S58112S Complete traumatic amputation at level between elbow and wrist, left arm, sequela: Secondary | ICD-10-CM

## 2020-10-05 DIAGNOSIS — R739 Hyperglycemia, unspecified: Secondary | ICD-10-CM

## 2020-10-05 DIAGNOSIS — F1021 Alcohol dependence, in remission: Secondary | ICD-10-CM | POA: Diagnosis not present

## 2020-10-05 NOTE — Progress Notes (Signed)
Annual Physical exam   Patient: Anne Waters   DOB: August 20, 1955   65 y.o. Female  MRN: [id number] Visit Date: 10/05/2020  Today's healthcare provider: Wilhemena Durie, MD   Chief Complaint  Patient presents with   Annual Exam   Had AWV on 04/01/2020.   Subjective    Anne Waters is a 65 y.o. female who presents today for a Annual Physical exam.  She reports consuming a general diet. The patient does not participate in regular exercise at present. She generally feels fairly well. She reports sleeping fairly well. She does not have additional problems to discuss today.  She is now retired.  She has been sober for more than 10 years.  She is involved in care of her mother who has no dementia but she needs caregivers.  She sees Dr. Darrick Huntsman for her skin exam She sees gynecology for well woman exam.  Past Medical History:  Diagnosis Date   Allergy    Depression    Past Surgical History:  Procedure Laterality Date   arm and hand surgery Bilateral    2010 left; 2011 right   Social History   Socioeconomic History   Marital status: Married    Spouse name: Not on file   Number of children: 2   Years of education: Not on file   Highest education level: Master's degree (e.g., MA, MS, MEng, MEd, MSW, MBA)  Occupational History   Not on file  Tobacco Use   Smoking status: Never   Smokeless tobacco: Never  Vaping Use   Vaping Use: Never used  Substance and Sexual Activity   Alcohol use: No    Alcohol/week: 0.0 standard drinks    Comment: Previous heavy drinker- currently in AA   Drug use: No   Sexual activity: Not on file  Other Topics Concern   Not on file  Social History Narrative   Not on file   Social Determinants of Health   Financial Resource Strain: Low Risk    Difficulty of Paying Living Expenses: Not hard at all  Food Insecurity: No Food Insecurity   Worried About Charity fundraiser in the Last Year: Never true   [place] in the  Last Year: Never true  Transportation Needs: No Transportation Needs   Lack of Transportation (Medical): No   Lack of Transportation (Non-Medical): No  Physical Activity: Insufficiently Active   Days of Exercise per Week: 4 days   Minutes of Exercise per Session: 30 min  Stress: Stress Concern Present   Feeling of Stress : Rather much  Social Connections: Socially Integrated   Frequency of Communication with Friends and Family: More than three times a week   Frequency of Social Gatherings with Friends and Family: More than three times a week   Attends Religious Services: More than 4 times per year   Active Member of [employer] or Organizations: Yes   Attends Music therapist: More than 4 times per year   Marital Status: Married  Human resources officer Violence: Not At Risk   Fear of Current or Ex-Partner: No   Emotionally Abused: No   Physically Abused: No   Sexually Abused: No   Family Status  Relation Name Status   Mother  Alive   MGM  Deceased   MGF  Deceased   PGM  Deceased   PGF  Deceased   Father  Deceased at age 49       CHF  Daughter 1 Alive   Daughter 2 Alive   Family History  Problem Relation Age of Onset   Cataracts Mother    Arthritis Mother    Macular degeneration Mother    Heart attack Maternal Grandmother    Hypertension Maternal Grandmother    Hypertension Maternal Grandfather    CVA Maternal Grandfather    Alzheimer's disease Maternal Grandfather    CVA Paternal Grandmother    Alzheimer's disease Paternal Grandmother    Hypertension Paternal Grandmother    Heart attack Paternal Grandfather    Healthy Daughter    Healthy Daughter    Allergies  Allergen Reactions   Hydrocodone-Acetaminophen     Other reaction(s): VOMITING   Sulfa Antibiotics     Patient Care Team: Jerrol Banana., MD as PCP - General (Family Medicine) Oneta Rack, MD (Dermatology) Maisie Fus, MD as Attending Physician (Obstetrics and Gynecology) Pa, [place]   Medications: Outpatient Medications Prior to Visit  Medication Sig   DULoxetine (CYMBALTA) 60 MG capsule TAKE 1 CAPSULE BY MOUTH EVERY DAY   estradiol (ESTRACE) 1 MG tablet Take 1 mg by mouth daily.   estradiol (ESTRACE) 2 MG tablet  (Patient not taking: No sig reported)   fluticasone (FLONASE) 50 MCG/ACT nasal spray Place 2 sprays into both nostrils daily.   Loratadine (CLARITIN PO) Take 10 mg by mouth daily.   meloxicam (MOBIC) 15 MG tablet Take 1 tablet (15 mg total) by mouth daily. (Patient not taking: Reported on 04/01/2020)   Multiple Vitamin (MULTI VITAMIN DAILY PO) 1 tablet daily.    multivitamin-lutein (OCUVITE-LUTEIN) CAPS capsule Take 1 capsule by mouth daily.   oxybutynin (DITROPAN-XL) 10 MG 24 hr tablet Take 1 tablet by mouth daily. (Patient not taking: No sig reported)   Probiotic Product (PROBIOTIC DAILY PO) Take by mouth daily.   progesterone (PROMETRIUM) 100 MG capsule TAKE 1 CAPSULE EACH NIGHT AT BEDTIME   tolterodine (DETROL LA) 4 MG 24 hr capsule Take 4 mg by mouth daily.   No facility-administered medications prior to visit.    Review of Systems  All other systems reviewed and are negative.     Objective    BP 133/74   Pulse 79   Resp 16   Ht '5\' 6"'$  (1.676 m)   Wt 227 lb (103 kg)   BMI 36.64 kg/m  BP Readings from Last 3 Encounters:  10/05/20 133/74  08/27/19 (!) 143/91  03/07/19 104/61   Wt Readings from Last 3 Encounters:  10/05/20 227 lb (103 kg)  08/27/19 209 lb (94.8 kg)  02/10/16 215 lb (97.5 kg)      Physical Exam Vitals reviewed.  Constitutional:      General: She is not in acute distress.    Appearance: She is well-developed. She is obese. She is not diaphoretic.  HENT:     Head: Normocephalic.     Right Ear: Tympanic membrane and external ear normal.     Left Ear: Tympanic membrane and external ear normal.     Nose: Nose normal.  Eyes:     General: No scleral icterus.    Conjunctiva/sclera: Conjunctivae normal.   Cardiovascular:     Rate and Rhythm: Normal rate and regular rhythm.     Pulses: Normal pulses.     Heart sounds: Normal heart sounds. No murmur heard.   No friction rub. No gallop.  Pulmonary:     Effort: Pulmonary effort is normal. No respiratory distress.  Breath sounds: Normal breath sounds. No wheezing or rales.  Abdominal:     Palpations: Abdomen is soft.  Musculoskeletal:     Comments: Left hand and wrist surgically absent  Skin:    General: Skin is warm and dry.  Neurological:     General: No focal deficit present.     Mental Status: She is alert and oriented to person, place, and time.  Psychiatric:        Behavior: Behavior normal.        Thought Content: Thought content normal.        Judgment: Judgment normal.      Last depression screening scores PHQ 2/9 Scores 10/05/2020 04/01/2020  PHQ - 2 Score 1 0  PHQ- 9 Score 4 -   Last fall risk screening Fall Risk  10/05/2020  Falls in the past year? 0  Number falls in past yr: 0  Injury with Fall? 0  Risk for fall due to : No Fall Risks  Follow up Falls evaluation completed   Last Audit-C alcohol use screening Alcohol Use Disorder Test (AUDIT) 10/05/2020  1. How often do you have a drink containing alcohol? 0  2. How many drinks containing alcohol do you have on a typical day when you are drinking? 0  3. How often do you have six or more drinks on one occasion? 0  AUDIT-C Score 0  Alcohol Brief Interventions/Follow-up -   A score of 3 or more in women, and 4 or more in men indicates increased risk for alcohol abuse, EXCEPT if all of the points are from question 1   No results found for any visits on 10/05/20.  Assessment & Plan    Routine Health Maintenance and Physical Exam  Exercise Activities and Dietary recommendations  Goals      DIET - REDUCE CALORIE INTAKE     Continue current diet plan of cutting back on fried foods, sugar and continue to eat 5 small meals a day.         Immunization History   Administered Date(s) Administered   Influenza,inj,Quad PF,6+ Mos 12/08/2019   Influenza-Unspecified 12/07/2014, 12/15/2016, 12/06/2017   PFIZER(Purple Top)SARS-COV-2 Vaccination 06/24/2019, 07/15/2019    Health Maintenance  Topic Date Due   Pneumococcal Vaccine 88-38 Years old (1 - PCV) Never done   HIV Screening  Never done   Hepatitis C Screening  Never done   Zoster Vaccines- Shingrix (1 of 2) Never done   MAMMOGRAM  Never done   PAP SMEAR-Modifier  10/05/2017   COVID-19 Vaccine (3 - Pfizer risk series) 08/12/2019   INFLUENZA VACCINE  09/07/2020   TETANUS/TDAP  04/01/2021 (Originally 05/01/1976)   Fecal DNA (Cologuard)  09/03/2022   HPV VACCINES  Aged Out    Discussed health benefits of physical activity, and encouraged her to engage in regular exercise appropriate for her age and condition.  1. Annual physical exam Well woman exam per GYN.  Cologuard every couple years.  She declines colonoscopy. - CBC with Differential/Platelet - Comprehensive metabolic panel - Lipid panel - TSH - Hepatitis C Antibody  2. Hyperglycemia Follow A1c - Hemoglobin A1c  3. Alcohol use disorder, moderate, in sustained remission, dependence ([place]) Patient has been sober for over 10 years.  4. Depression, major, single episode, complete remission (HCC) Continue duloxetine for now.  We discussed stopping but she wishes to continue  5. Below-elbow amputation of left upper extremity, sequela (HCC)    No follow-ups on file.     I,  Nathaneal Sommers Cranford Mon, MD, have reviewed all documentation for this visit. The documentation on 10/09/20 for the exam, diagnosis, procedures, and orders are all accurate and complete.    Elveta Rape Cranford Mon, MD  [facility] [phone] (phone) [phone] (fax)  [place]

## 2020-10-06 LAB — CBC WITH DIFFERENTIAL/PLATELET
Basophils Absolute: 0.1 10*3/uL (ref 0.0–0.2)
Basos: 1 %
EOS (ABSOLUTE): 0.3 10*3/uL (ref 0.0–0.4)
Eos: 4 %
Hematocrit: 39.6 % (ref 34.0–46.6)
Hemoglobin: 13.2 g/dL (ref 11.1–15.9)
Immature Grans (Abs): 0 10*3/uL (ref 0.0–0.1)
Immature Granulocytes: 0 %
Lymphocytes Absolute: 1.8 10*3/uL (ref 0.7–3.1)
Lymphs: 26 %
MCH: 28.9 pg (ref 26.6–33.0)
MCHC: 33.3 g/dL (ref 31.5–35.7)
MCV: 87 fL (ref 79–97)
Monocytes Absolute: 0.6 10*3/uL (ref 0.1–0.9)
Monocytes: 9 %
Neutrophils Absolute: 4.2 10*3/uL (ref 1.4–7.0)
Neutrophils: 60 %
Platelets: 359 10*3/uL (ref 150–450)
RBC: 4.56 x10E6/uL (ref 3.77–5.28)
RDW: 12.6 % (ref 11.7–15.4)
WBC: 7 10*3/uL (ref 3.4–10.8)

## 2020-10-06 LAB — LIPID PANEL
Chol/HDL Ratio: 3.7 ratio (ref 0.0–4.4)
Cholesterol, Total: 190 mg/dL (ref 100–199)
HDL: 51 mg/dL (ref >39)
LDL Chol Calc (NIH): 118 mg/dL — ABNORMAL HIGH (ref 0–99)
Triglycerides: 116 mg/dL (ref 0–149)
VLDL Cholesterol Cal: 21 mg/dL (ref 5–40)

## 2020-10-06 LAB — COMPREHENSIVE METABOLIC PANEL
ALT: 16 IU/L (ref 0–32)
AST: 19 IU/L (ref 0–40)
Albumin/Globulin Ratio: 1.7 (ref 1.2–2.2)
Albumin: 4.5 g/dL (ref 3.8–4.8)
Alkaline Phosphatase: 86 IU/L (ref 44–121)
BUN/Creatinine Ratio: 18 (ref 12–28)
BUN: 14 mg/dL (ref 8–27)
Bilirubin Total: 0.2 mg/dL (ref 0.0–1.2)
CO2: 24 mmol/L (ref 20–29)
Calcium: 9.8 mg/dL (ref 8.7–10.3)
Chloride: 105 mmol/L (ref 96–106)
Creatinine, Ser: 0.79 mg/dL (ref 0.57–1.00)
Globulin, Total: 2.6 g/dL (ref 1.5–4.5)
Glucose: 77 mg/dL (ref 65–99)
Potassium: 4.6 mmol/L (ref 3.5–5.2)
Sodium: 143 mmol/L (ref 134–144)
Total Protein: 7.1 g/dL (ref 6.0–8.5)
eGFR: 84 mL/min/{1.73_m2} (ref >59)

## 2020-10-06 LAB — HEMOGLOBIN A1C
Est. average glucose Bld gHb Est-mCnc: 114 mg/dL
Hgb A1c MFr Bld: 5.6 % (ref 4.8–5.6)

## 2020-10-06 LAB — HEPATITIS C ANTIBODY: Hep C Virus Ab: <0.1 s/co ratio (ref 0.0–0.9)

## 2020-10-06 LAB — TSH: TSH: 2.79 u[IU]/mL (ref 0.450–4.500)

## 2020-10-16 ENCOUNTER — Other Ambulatory Visit: Payer: Self-pay | Admitting: Family Medicine

## 2020-10-16 DIAGNOSIS — F325 Major depressive disorder, single episode, in full remission: Secondary | ICD-10-CM

## 2021-01-12 ENCOUNTER — Ambulatory Visit: Payer: Medicare PPO | Admitting: Podiatry

## 2021-01-12 ENCOUNTER — Other Ambulatory Visit: Payer: Self-pay

## 2021-01-12 ENCOUNTER — Ambulatory Visit: Payer: Medicare Other

## 2021-01-12 DIAGNOSIS — M778 Other enthesopathies, not elsewhere classified: Secondary | ICD-10-CM | POA: Diagnosis not present

## 2021-01-12 DIAGNOSIS — M79604 Pain in right leg: Secondary | ICD-10-CM

## 2021-01-12 MED ORDER — MELOXICAM 15 MG PO TABS
15.0000 mg | ORAL_TABLET | Freq: Every day | ORAL | 3 refills | Status: DC
Start: 2021-01-12 — End: 2022-02-09

## 2021-01-12 MED ORDER — BETAMETHASONE SOD PHOS & ACET 6 (3-3) MG/ML IJ SUSP: 3.0000 mg | Freq: Once | INTRAMUSCULAR | Status: DC

## 2021-01-12 NOTE — Progress Notes (Signed)
   HPI: 65 y.o. female presenting today as a new patient for evaluation of left lateral foot pain this been going on approximately 1 month now.  She denies a history of injury.  She says that over time she gradually had increased pain and tenderness to the lateral aspect of the left foot.  She is retired but very active.  She has not done anything for treatment at the moment.  She presents for further treatment evaluation  Past Medical History:  Diagnosis Date   Allergy    Depression      Physical Exam: General: The patient is alert and oriented x3 in no acute distress.  Dermatology: Skin is warm, dry and supple bilateral lower extremities. Negative for open lesions or macerations.  Vascular: Palpable pedal pulses bilaterally. No edema or erythema noted. Capillary refill within normal limits.  Neurological: Epicritic and protective threshold grossly intact bilaterally.   Musculoskeletal Exam: Range of motion within normal limits to all pedal and ankle joints bilateral. Muscle strength 5/5 in all groups bilateral.  Pain on palpation throughout the left lateral TMT joint around the base of the fourth metatarsal  Radiographic Exam:  Normal osseous mineralization. Joint spaces preserved. No fracture/dislocation/boney destruction.    Assessment: 1.  Capsulitis left lateral TMT   Plan of Care:  1. Patient evaluated. X-Rays reviewed.  2.  Patient declined a cam boot today. 3.  Injection of 0.5 cc Celestone Soluspan injected into the lateral aspect of the TMT joint 4.  Prescription for meloxicam 15 mg daily 5.  Return to clinic in 4 weeks  *Retired but very busy and active with grandkids, church, etc.      Edrick Kins, DPM [facility]  Dr. Edrick Kins, DPM    [address], [place] [address]                Office [phone]  Fax [phone]

## 2021-02-15 DIAGNOSIS — H2513 Age-related nuclear cataract, bilateral: Secondary | ICD-10-CM | POA: Diagnosis not present

## 2021-02-16 ENCOUNTER — Ambulatory Visit: Payer: Medicare PPO | Admitting: Podiatry

## 2021-03-08 DIAGNOSIS — H02409 Unspecified ptosis of unspecified eyelid: Secondary | ICD-10-CM | POA: Diagnosis not present

## 2021-03-23 DIAGNOSIS — H02831 Dermatochalasis of right upper eyelid: Secondary | ICD-10-CM | POA: Diagnosis not present

## 2021-03-23 DIAGNOSIS — H5340 Unspecified visual field defects: Secondary | ICD-10-CM | POA: Diagnosis not present

## 2021-03-23 DIAGNOSIS — H02834 Dermatochalasis of left upper eyelid: Secondary | ICD-10-CM | POA: Diagnosis not present

## 2021-04-06 DIAGNOSIS — G5600 Carpal tunnel syndrome, unspecified upper limb: Secondary | ICD-10-CM | POA: Insufficient documentation

## 2021-04-07 ENCOUNTER — Ambulatory Visit (INDEPENDENT_AMBULATORY_CARE_PROVIDER_SITE_OTHER): Payer: Medicare PPO

## 2021-04-07 VITALS — Ht 66.0 in | Wt 221.0 lb

## 2021-04-07 DIAGNOSIS — Z0000 Encounter for general adult medical examination without abnormal findings: Secondary | ICD-10-CM

## 2021-04-07 DIAGNOSIS — Z1231 Encounter for screening mammogram for malignant neoplasm of breast: Secondary | ICD-10-CM

## 2021-04-07 NOTE — Progress Notes (Signed)
Virtual Visit via Telephone Note  I connected with  Anne Waters on 04/07/21 at 10:20 AM EST by telephone and verified that I am speaking with the correct person using two identifiers.  Location: Patient: home Provider: [facility] Persons participating in the virtual visit: Anne Waters   I discussed the limitations, risks, security and privacy concerns of performing an evaluation and management service by telephone and the availability of in person appointments. The patient expressed understanding and agreed to proceed.  Interactive audio and video telecommunications were attempted between this nurse and patient, however failed, due to patient having technical difficulties OR patient did not have access to video capability.  We continued and completed visit with audio only.  Some vital signs may be absent or patient reported.   Anne David, LPN  Subjective:   Anne Waters is a 66 y.o. female who presents for Medicare Annual (Subsequent) preventive examination.  Review of Systems           Objective:    Today's Vitals   04/07/21 1020  Weight: 221 lb (100.2 kg)  Height: 5\' 6"  (1.676 m)   Body mass index is 35.67 kg/m.  Advanced Directives 04/01/2020  Does Patient Have a Medical Advance Directive? Yes  Type of Paramedic of [place];Living will  Copy of [place] in Chart? No - copy requested    Current Medications (verified) Outpatient Encounter Medications as of 04/07/2021  Medication Sig   DULoxetine (CYMBALTA) 60 MG capsule TAKE 1 CAPSULE BY MOUTH EVERY DAY   estradiol (ESTRACE) 1 MG tablet Take 1 mg by mouth daily.   estradiol (ESTRACE) 2 MG tablet  (Patient not taking: No sig reported)   fluticasone (FLONASE) 50 MCG/ACT nasal spray Place 2 sprays into both nostrils daily.   Loratadine (CLARITIN PO) Take 10 mg by mouth daily.   meloxicam (MOBIC) 15 MG tablet Take 1 tablet (15 mg total) by mouth  daily.   Multiple Vitamin (MULTI VITAMIN DAILY PO) 1 tablet daily.    multivitamin-lutein (OCUVITE-LUTEIN) CAPS capsule Take 1 capsule by mouth daily.   oxybutynin (DITROPAN-XL) 10 MG 24 hr tablet Take 1 tablet by mouth daily. (Patient not taking: No sig reported)   Probiotic Product (PROBIOTIC DAILY PO) Take by mouth daily.   progesterone (PROMETRIUM) 100 MG capsule TAKE 1 CAPSULE EACH NIGHT AT BEDTIME   tolterodine (DETROL LA) 4 MG 24 hr capsule Take 4 mg by mouth daily.   Facility-Administered Encounter Medications as of 04/07/2021  Medication   betamethasone acetate-betamethasone sodium phosphate (CELESTONE) injection 3 mg    Allergies (verified) Hydrocodone-acetaminophen and Sulfa antibiotics   History: Past Medical History:  Diagnosis Date   Allergy    Depression    Past Surgical History:  Procedure Laterality Date   arm and hand surgery Bilateral    2010 left; 2011 right   Family History  Problem Relation Age of Onset   Cataracts Mother    Arthritis Mother    Macular degeneration Mother    Heart attack Maternal Grandmother    Hypertension Maternal Grandmother    Hypertension Maternal Grandfather    CVA Maternal Grandfather    Alzheimer's disease Maternal Grandfather    CVA Paternal Grandmother    Alzheimer's disease Paternal Grandmother    Hypertension Paternal Grandmother    Heart attack Paternal Grandfather    Healthy Daughter    Healthy Daughter    Social History   Socioeconomic History   Marital status: Married  Spouse name: Not on file   Number of children: 2   Years of education: Not on file   Highest education level: Master's degree (e.g., MA, MS, MEng, MEd, MSW, MBA)  Occupational History   Not on file  Tobacco Use   Smoking status: Never   Smokeless tobacco: Never  Vaping Use   Vaping Use: Never used  Substance and Sexual Activity   Alcohol use: No    Alcohol/week: 0.0 standard drinks    Comment: Previous heavy drinker- currently in AA    Drug use: No   Sexual activity: Not on file  Other Topics Concern   Not on file  Social History Narrative   Not on file   Social Determinants of Health   Financial Resource Strain: Not on file  Food Insecurity: Not on file  Transportation Needs: Not on file  Physical Activity: Not on file  Stress: Not on file  Social Connections: Not on file    Tobacco Counseling Counseling given: Not Answered   Clinical Intake:  Pre-visit preparation completed: No  Pain : No/denies pain     Nutritional Risks: None Diabetes: No  How often do you need to have someone help you when you read instructions, pamphlets, or other written materials from your doctor or pharmacy?: 1 - Never  Diabetic?no  Interpreter Needed?: No  Information entered by :: Anne Shaggy, LPN   Activities of Daily Living In your present state of health, do you have any difficulty performing the following activities: 10/05/2020  Hearing? Y  Vision? N  Difficulty concentrating or making decisions? N  Walking or climbing stairs? N  Dressing or bathing? N  Doing errands, shopping? N  Some recent data might be hidden    Patient Care Team: Anne Waters., MD as PCP - General (Family Medicine) Oneta Rack, MD (Dermatology) Maisie Fus, MD as Attending Physician (Obstetrics and Gynecology) Pa, [place] any recent Medical Services you may have received from other than [facility] providers in the past year (date may be approximate).     Assessment:   This is a routine wellness examination for [name].  Hearing/Vision screen No results found.  Dietary issues and exercise activities discussed:     Goals Addressed   None    Depression Screen PHQ 2/9 Scores 10/05/2020 04/01/2020  PHQ - 2 Score 1 0  PHQ- 9 Score 4 -    Fall Risk Fall Risk  10/05/2020 04/01/2020  Falls in the past year? 0 0  Number falls in past yr: 0 0  Injury with Fall? 0 0  Risk for fall due to :  No Fall Risks -  Follow up Falls evaluation completed -    FALL RISK PREVENTION PERTAINING TO THE HOME:  Any stairs in or around the home? Yes  If so, are there any without handrails? No  Home free of loose throw rugs in walkways, pet beds, electrical cords, etc? Yes  Adequate lighting in your home to reduce risk of falls? Yes   ASSISTIVE DEVICES UTILIZED TO PREVENT FALLS:  Life alert? No  Use of a cane, walker or w/c? No  Grab bars in the bathroom? Yes  Shower chair or bench in shower? Yes  Elevated toilet seat or a handicapped toilet? Yes   Cognitive Function: Normal cognitive status assessed by direct observation by this Nurse Health Advisor. No abnormalities found.       6CIT Screen 04/01/2020  What Year? 0 points  What month? 0 points  What time? 0 points  Count back from 20 0 points  Months in reverse 0 points  Repeat phrase 0 points  Total Score 0    Immunizations Immunization History  Administered Date(s) Administered   Influenza,inj,Quad PF,6+ Mos 12/08/2019   Influenza-Unspecified 12/07/2014, 12/15/2016, 12/06/2017   PFIZER(Purple Top)SARS-COV-2 Vaccination 06/24/2019, 07/15/2019    TDAP status: Due, Education has been provided regarding the importance of this vaccine. Advised may receive this vaccine at local pharmacy or Health Dept. Aware to provide a copy of the vaccination record if obtained from local pharmacy or Health Dept. Verbalized acceptance and understanding.  Flu Vaccine status: Up to date  Pneumococcal vaccine status: Declined,  Education has been provided regarding the importance of this vaccine but patient still declined. Advised may receive this vaccine at local pharmacy or Health Dept. Aware to provide a copy of the vaccination record if obtained from local pharmacy or Health Dept. Verbalized acceptance and understanding.   Covid-19 vaccine status: Completed vaccines  Qualifies for Shingles Vaccine? Yes   Zostavax completed No   Shingrix  Completed?: No.    Education has been provided regarding the importance of this vaccine. Patient has been advised to call insurance company to determine out of pocket expense if they have not yet received this vaccine. Advised may also receive vaccine at local pharmacy or Health Dept. Verbalized acceptance and understanding.  Screening Tests Health Maintenance  Topic Date Due   HIV Screening  Never done   TETANUS/TDAP  Never done   Zoster Vaccines- Shingrix (1 of 2) Never done   MAMMOGRAM  Never done   PAP SMEAR-Modifier  10/05/2017   COVID-19 Vaccine (3 - Pfizer risk series) 08/12/2019   INFLUENZA VACCINE  09/07/2020   Fecal DNA (Cologuard)  09/03/2022   Hepatitis C Screening  Completed   HPV VACCINES  Aged Out    Health Maintenance  Health Maintenance Due  Topic Date Due   HIV Screening  Never done   TETANUS/TDAP  Never done   Zoster Vaccines- Shingrix (1 of 2) Never done   MAMMOGRAM  Never done   PAP SMEAR-Modifier  10/05/2017   COVID-19 Vaccine (3 - Pfizer risk series) 08/12/2019   INFLUENZA VACCINE  09/07/2020    Colorectal cancer screening: Type of screening: Cologuard. Completed 09/04/19. Repeat every 3 years  Mammogram status: Completed by MD in [place]. Repeat every year   Lung Cancer Screening: (Low Dose CT Chest recommended if Age 2-80 years, 30 pack-year currently smoking OR have quit w/in 15years.) does not qualify.    Additional Screening:  Hepatitis C Screening: does qualify; Completed 10/05/20  Vision Screening: Recommended annual ophthalmology exams for early detection of glaucoma and other disorders of the eye. Is the patient up to date with their annual eye exam?  Yes  Who is the provider or what is the name of the office in which the patient attends annual eye exams? [facility] If pt is not established with a provider, would they like to be referred to a provider to establish care? No .   Dental Screening: Recommended annual dental exams  for proper oral hygiene  Community Resource Referral / Chronic Care Management: CRR required this visit?  No   CCM required this visit?  No      Plan:     I have personally reviewed and noted the following in the patient's chart:   Medical and social history Use of alcohol, tobacco or illicit drugs  Current medications and supplements including opioid prescriptions.  Functional ability and status Nutritional status Physical activity Advanced directives List of other physicians Hospitalizations, surgeries, and ER visits in previous 12 months Vitals Screenings to include cognitive, depression, and falls Referrals and appointments  In addition, I have reviewed and discussed with patient certain preventive protocols, quality metrics, and best practice recommendations. A written personalized care plan for preventive services as well as general preventive health recommendations were provided to patient.     Anne David, LPN   [birth year]   Nurse Notes: none

## 2021-04-07 NOTE — Patient Instructions (Addendum)
Anne Waters , Thank you for taking time to come for your Medicare Wellness Visit. I appreciate your ongoing commitment to your health goals. Please review the following plan we discussed and let me know if I can assist you in the future.   Screening recommendations/referrals: Colonoscopy: 09/04/19 Mammogram: referral sent Bone Density: n/d Recommended yearly ophthalmology/optometry visit for glaucoma screening and checkup Recommended yearly dental visit for hygiene and checkup  Vaccinations: Influenza vaccine: 12/08/19 Pneumococcal vaccine: n/d Tdap vaccine: n/d Shingles vaccine: n/d  Covid-19: 06/24/19, 07/15/19  Advanced directives: no  Conditions/risks identified: none  Next appointment: Follow up in one year for your annual wellness visit. - 04/11/22 @ 10:20am by phone  Preventive Care 40-64 Years, Female Preventive care refers to lifestyle choices and visits with your health care provider that can promote health and wellness. What does preventive care include?  A yearly physical exam. This is also called an annual well check. Dental exams once or twice a year. Routine eye exams. Ask your health care provider how often you should have your eyes checked. Personal lifestyle choices, including: Daily care of your teeth and gums. Regular physical activity. Eating a healthy diet. Avoiding tobacco and drug use. Limiting alcohol use. Practicing safe sex. Taking low-dose aspirin daily starting at age 35. Taking vitamin and mineral supplements as recommended by your health care provider. What happens during an annual well check? The services and screenings done by your health care provider during your annual well check will depend on your age, overall health, lifestyle risk factors, and family history of disease. Counseling  Your health care provider may ask you questions about your: Alcohol use. Tobacco use. Drug use. Emotional well-being. Home and relationship  well-being. Sexual activity. Eating habits. Work and work Statistician. Method of birth control. Menstrual cycle. Pregnancy history. Screening  You may have the following tests or measurements: Height, weight, and BMI. Blood pressure. Lipid and cholesterol levels. These may be checked every 5 years, or more frequently if you are over 74 years old. Skin check. Lung cancer screening. You may have this screening every year starting at age 86 if you have a 30-pack-year history of smoking and currently smoke or have quit within the past 15 years. Fecal occult blood test (FOBT) of the stool. You may have this test every year starting at age 85. Flexible sigmoidoscopy or colonoscopy. You may have a sigmoidoscopy every 5 years or a colonoscopy every 10 years starting at age 40. Hepatitis C blood test. Hepatitis B blood test. Sexually transmitted disease (STD) testing. Diabetes screening. This is done by checking your blood sugar (glucose) after you have not eaten for a while (fasting). You may have this done every 1-3 years. Mammogram. This may be done every 1-2 years. Talk to your health care provider about when you should start having regular mammograms. This may depend on whether you have a family history of breast cancer. BRCA-related cancer screening. This may be done if you have a family history of breast, ovarian, tubal, or peritoneal cancers. Pelvic exam and Pap test. This may be done every 3 years starting at age 16. Starting at age 74, this may be done every 5 years if you have a Pap test in combination with an HPV test. Bone density scan. This is done to screen for osteoporosis. You may have this scan if you are at high risk for osteoporosis. Discuss your test results, treatment options, and if necessary, the need for more tests with your health care provider.  Vaccines  Your health care provider may recommend certain vaccines, such as: Influenza vaccine. This is recommended every  year. Tetanus, diphtheria, and acellular pertussis (Tdap, Td) vaccine. You may need a Td booster every 10 years. Zoster vaccine. You may need this after age 78. Pneumococcal 13-valent conjugate (PCV13) vaccine. You may need this if you have certain conditions and were not previously vaccinated. Pneumococcal polysaccharide (PPSV23) vaccine. You may need one or two doses if you smoke cigarettes or if you have certain conditions. Talk to your health care provider about which screenings and vaccines you need and how often you need them. This information is not intended to replace advice given to you by your health care provider. Make sure you discuss any questions you have with your health care provider. Document Released: 02/20/2015 Document Revised: 10/14/2015 Document Reviewed: 11/25/2014 Elsevier Interactive Patient Education  2017 [place] Prevention in the Home Falls can cause injuries. They can happen to people of all ages. There are many things you can do to make your home safe and to help prevent falls. What can I do on the outside of my home? Regularly fix the edges of walkways and driveways and fix any cracks. Remove anything that might make you trip as you walk through a door, such as a raised step or threshold. Trim any bushes or trees on the path to your home. Use bright outdoor lighting. Clear any walking paths of anything that might make someone trip, such as rocks or tools. Regularly check to see if handrails are loose or broken. Make sure that both sides of any steps have handrails. Any raised decks and porches should have guardrails on the edges. Have any leaves, snow, or ice cleared regularly. Use sand or salt on walking paths during winter. Clean up any spills in your garage right away. This includes oil or grease spills. What can I do in the bathroom? Use night lights. Install grab bars by the toilet and in the tub and shower. Do not use towel bars as grab  bars. Use non-skid mats or decals in the tub or shower. If you need to sit down in the shower, use a plastic, non-slip stool. Keep the floor dry. Clean up any water that spills on the floor as soon as it happens. Remove soap buildup in the tub or shower regularly. Attach bath mats securely with double-sided non-slip rug tape. Do not have throw rugs and other things on the floor that can make you trip. What can I do in the bedroom? Use night lights. Make sure that you have a light by your bed that is easy to reach. Do not use any sheets or blankets that are too big for your bed. They should not hang down onto the floor. Have a firm chair that has side arms. You can use this for support while you get dressed. Do not have throw rugs and other things on the floor that can make you trip. What can I do in the kitchen? Clean up any spills right away. Avoid walking on wet floors. Keep items that you use a lot in easy-to-reach places. If you need to reach something above you, use a strong step stool that has a grab bar. Keep electrical cords out of the way. Do not use floor polish or wax that makes floors slippery. If you must use wax, use non-skid floor wax. Do not have throw rugs and other things on the floor that can make you  trip. What can I do with my stairs? Do not leave any items on the stairs. Make sure that there are handrails on both sides of the stairs and use them. Fix handrails that are broken or loose. Make sure that handrails are as long as the stairways. Check any carpeting to make sure that it is firmly attached to the stairs. Fix any carpet that is loose or worn. Avoid having throw rugs at the top or bottom of the stairs. If you do have throw rugs, attach them to the floor with carpet tape. Make sure that you have a light switch at the top of the stairs and the bottom of the stairs. If you do not have them, ask someone to add them for you. What else can I do to help prevent  falls? Wear shoes that: Do not have high heels. Have rubber bottoms. Are comfortable and fit you well. Are closed at the toe. Do not wear sandals. If you use a stepladder: Make sure that it is fully opened. Do not climb a closed stepladder. Make sure that both sides of the stepladder are locked into place. Ask someone to hold it for you, if possible. Clearly mark and make sure that you can see: Any grab bars or handrails. First and last steps. Where the edge of each step is. Use tools that help you move around (mobility aids) if they are needed. These include: Canes. Walkers. Scooters. Crutches. Turn on the lights when you go into a dark area. Replace any light bulbs as soon as they burn out. Set up your furniture so you have a clear path. Avoid moving your furniture around. If any of your floors are uneven, fix them. If there are any pets around you, be aware of where they are. Review your medicines with your doctor. Some medicines can make you feel dizzy. This can increase your chance of falling. Ask your doctor what other things that you can do to help prevent falls. This information is not intended to replace advice given to you by your health care provider. Make sure you discuss any questions you have with your health care provider. Document Released: 11/20/2008 Document Revised: 07/02/2015 Document Reviewed: 02/28/2014 Elsevier Interactive Patient Education  2017 [employer].

## 2021-04-10 ENCOUNTER — Other Ambulatory Visit: Payer: Self-pay | Admitting: Family Medicine

## 2021-04-10 DIAGNOSIS — F325 Major depressive disorder, single episode, in full remission: Secondary | ICD-10-CM

## 2021-05-12 ENCOUNTER — Telehealth: Payer: Medicare PPO | Admitting: Urgent Care

## 2021-05-12 DIAGNOSIS — J208 Acute bronchitis due to other specified organisms: Secondary | ICD-10-CM | POA: Diagnosis not present

## 2021-05-12 DIAGNOSIS — B9689 Other specified bacterial agents as the cause of diseases classified elsewhere: Secondary | ICD-10-CM

## 2021-05-12 MED ORDER — DOXYCYCLINE HYCLATE 100 MG PO TABS: 100.0000 mg | ORAL_TABLET | Freq: Two times a day (BID) | ORAL | 0 refills | Status: DC

## 2021-05-12 MED ORDER — BENZONATATE 100 MG PO CAPS: 100.0000 mg | ORAL_CAPSULE | Freq: Three times a day (TID) | ORAL | 0 refills | Status: DC | PRN

## 2021-05-12 NOTE — Progress Notes (Signed)
I have spent 5 minutes in review of e-visit questionnaire, review and updating patient chart, medical decision making and response to patient.   Jarrette Dehner Cody Ascencion Coye, PA-C    

## 2021-05-12 NOTE — Progress Notes (Signed)

## 2021-06-01 DIAGNOSIS — H5340 Unspecified visual field defects: Secondary | ICD-10-CM | POA: Diagnosis not present

## 2021-06-30 DIAGNOSIS — Z85820 Personal history of malignant melanoma of skin: Secondary | ICD-10-CM | POA: Diagnosis not present

## 2021-06-30 DIAGNOSIS — S30860A Insect bite (nonvenomous) of lower back and pelvis, initial encounter: Secondary | ICD-10-CM | POA: Diagnosis not present

## 2021-06-30 DIAGNOSIS — D2272 Melanocytic nevi of left lower limb, including hip: Secondary | ICD-10-CM | POA: Diagnosis not present

## 2021-06-30 DIAGNOSIS — D2261 Melanocytic nevi of right upper limb, including shoulder: Secondary | ICD-10-CM | POA: Diagnosis not present

## 2021-06-30 DIAGNOSIS — Z85828 Personal history of other malignant neoplasm of skin: Secondary | ICD-10-CM | POA: Diagnosis not present

## 2021-06-30 DIAGNOSIS — L821 Other seborrheic keratosis: Secondary | ICD-10-CM | POA: Diagnosis not present

## 2021-06-30 DIAGNOSIS — Z86006 Personal history of melanoma in-situ: Secondary | ICD-10-CM | POA: Diagnosis not present

## 2021-10-06 ENCOUNTER — Encounter: Payer: Medicare PPO | Admitting: Family Medicine

## 2021-10-10 ENCOUNTER — Other Ambulatory Visit: Payer: Self-pay | Admitting: Family Medicine

## 2021-10-10 DIAGNOSIS — F325 Major depressive disorder, single episode, in full remission: Secondary | ICD-10-CM

## 2021-10-14 DIAGNOSIS — Z13220 Encounter for screening for lipoid disorders: Secondary | ICD-10-CM | POA: Diagnosis not present

## 2021-10-14 DIAGNOSIS — R7309 Other abnormal glucose: Secondary | ICD-10-CM | POA: Diagnosis not present

## 2021-10-14 DIAGNOSIS — N951 Menopausal and female climacteric states: Secondary | ICD-10-CM | POA: Diagnosis not present

## 2021-10-14 DIAGNOSIS — Z1329 Encounter for screening for other suspected endocrine disorder: Secondary | ICD-10-CM | POA: Diagnosis not present

## 2021-10-14 DIAGNOSIS — Z131 Encounter for screening for diabetes mellitus: Secondary | ICD-10-CM | POA: Diagnosis not present

## 2021-10-14 DIAGNOSIS — Z01419 Encounter for gynecological examination (general) (routine) without abnormal findings: Secondary | ICD-10-CM | POA: Diagnosis not present

## 2021-10-14 DIAGNOSIS — Z6839 Body mass index (BMI) 39.0-39.9, adult: Secondary | ICD-10-CM | POA: Diagnosis not present

## 2021-10-14 DIAGNOSIS — E7800 Pure hypercholesterolemia, unspecified: Secondary | ICD-10-CM | POA: Diagnosis not present

## 2021-10-14 DIAGNOSIS — Z1231 Encounter for screening mammogram for malignant neoplasm of breast: Secondary | ICD-10-CM | POA: Diagnosis not present

## 2021-10-14 DIAGNOSIS — Z13228 Encounter for screening for other metabolic disorders: Secondary | ICD-10-CM | POA: Diagnosis not present

## 2021-10-14 LAB — HM MAMMOGRAPHY

## 2021-12-02 ENCOUNTER — Encounter: Payer: Medicare PPO | Admitting: Family Medicine

## 2022-01-07 ENCOUNTER — Other Ambulatory Visit: Payer: Self-pay | Admitting: Family Medicine

## 2022-01-07 DIAGNOSIS — F325 Major depressive disorder, single episode, in full remission: Secondary | ICD-10-CM

## 2022-01-07 MED ORDER — DULOXETINE HCL 60 MG PO CPEP: ORAL_CAPSULE | ORAL | 0 refills | Status: DC

## 2022-01-07 NOTE — Telephone Encounter (Signed)
Medication Refill - Medication: DULoxetine (CYMBALTA) 60 MG capsule   Has the patient contacted their pharmacy? No. (Agent: If no, request that the patient contact the pharmacy for the refill. If patient does not wish to contact the pharmacy document the reason why and proceed with request.) (Agent: If yes, when and what did the pharmacy advise?)  Preferred Pharmacy (with phone number or street name):  [id number] [place]: [phone] Fax: [phone]    Has the patient been seen for an appointment in the last year OR does the patient have an upcoming appointment? Yes.    Agent: Please be advised that RX refills may take up to 3 business days. We ask that you follow-up with your pharmacy.

## 2022-01-07 NOTE — Telephone Encounter (Signed)
Requested medication (s) are due for refill today:   Yes  Requested medication (s) are on the active medication list:   Yes  Future visit scheduled:   Yes 02/09/2022 with Dr. Quentin Cornwall   Last ordered: 10/12/2021 #90, 0 refills  Returned because labs are due.  Provider to review for refills prior to upcoming appt.   Requested Prescriptions  Pending Prescriptions Disp Refills   DULoxetine (CYMBALTA) 60 MG capsule      Sig: Take by mouth daily.     Psychiatry: Antidepressants - SNRI - duloxetine Failed - 01/07/2022 12:01 PM      Failed - Cr in normal range and within 360 days    Creatinine, Ser  Date Value Ref Range Status  10/05/2020 0.79 0.57 - 1.00 mg/dL Final         Failed - eGFR is 30 or above and within 360 days    GFR calc Af Amer  Date Value Ref Range Status  03/12/2008  >60 mL/min Final   >60        The eGFR has been calculated using the MDRD equation. This calculation has not been validated in all clinical situations. eGFR's persistently <60 mL/min signify possible Chronic Kidney Disease.   GFR calc non Af Amer  Date Value Ref Range Status  03/12/2008 >60 >60 mL/min Final   eGFR  Date Value Ref Range Status  10/05/2020 84 >59 mL/min/1.73 Final         Failed - Valid encounter within last 6 months    Recent Outpatient Visits           1 year ago Annual physical exam   [facility] Jerrol Banana., MD   2 years ago Leg pain, diffuse, right   [facility] Jerrol Banana., MD   2 years ago Depression, major, single episode, complete remission [facility]   [facility] Jerrol Banana., MD   5 years ago Acute pansinusitis, recurrence not specified   [place], Clearnce Sorrel, [place]   6 years ago [facility], Clearnce Sorrel, [place]       Future Appointments             In 1 month Simmons-Robinson, Riki Sheer, MD [facility] [place] - Completed PHQ-2 or PHQ-9 in the last 360 days      Passed - Last BP in normal range    BP Readings from Last 1 Encounters:  10/05/20 133/74

## 2022-01-29 ENCOUNTER — Telehealth: Payer: Medicare PPO | Admitting: Physician Assistant

## 2022-01-29 DIAGNOSIS — J019 Acute sinusitis, unspecified: Secondary | ICD-10-CM | POA: Diagnosis not present

## 2022-01-29 DIAGNOSIS — B9689 Other specified bacterial agents as the cause of diseases classified elsewhere: Secondary | ICD-10-CM

## 2022-01-29 MED ORDER — AMOXICILLIN-POT CLAVULANATE 875-125 MG PO TABS: 1.0000 | ORAL_TABLET | Freq: Two times a day (BID) | ORAL | 0 refills | Status: DC

## 2022-01-29 NOTE — Progress Notes (Signed)

## 2022-02-04 NOTE — Progress Notes (Signed)
I,Sulibeya S Dimas,acting as a Neurosurgeon for [employer], MD.,have documented all relevant documentation on the behalf of Ronnald Ramp, MD,as directed by  Ronnald Ramp, MD while in the presence of Ronnald Ramp, MD.     Established patient visit   Patient: Anne Waters   DOB: [birth year]   66 y.o. Female  MRN: [id number] Visit Date: 02/09/2022  Today's healthcare provider: Ronnald Ramp, MD   Chief Complaint  Patient presents with   Depression   Subjective    HPI  Depression, Follow-up  She  was last seen for this 16 months ago. Changes made at last visit include no changes, patient requested to continue duloxetine.   She reports excellent compliance with treatment. She is not having side effects.  She feels she is Improved since last visit. She states she is in good spirits  And wants to continue to avoid narcotic prescriptions at all costs  She states that tired feelings result of driving to [employer] 4 days per week, 45-38min drive each way       4/0/1027    1:24 PM 04/07/2021   10:26 AM 10/05/2020    1:18 PM  Depression screen PHQ 2/9  Decreased Interest 0 1 1  Down, Depressed, Hopeless 0 0 0  PHQ - 2 Score 0 1 1  Altered sleeping 1  1  Tired, decreased energy 2  1  Change in appetite 1  1  Feeling bad or failure about yourself  0  0  Trouble concentrating 0    Moving slowly or fidgety/restless 0  0  Suicidal thoughts 0  0  PHQ-9 Score 4  4  Difficult doing work/chores Not difficult at all  Not difficult at all    ----------------------------------------------------------------------------------------- Sleep Apnea  Patient reports that she used to use sleep med to help with CPAP supplies She is unsure the current supplier as the [employer] has changed names  Patient would like to call once she is able to clarify the name of the CPAP company   Medications: Outpatient Medications Prior  to Visit  Medication Sig   DULoxetine (CYMBALTA) 60 MG capsule TAKE 1 CAPSULE BY MOUTH EVERY DAY   estradiol (ESTRACE) 1 MG tablet Take 1 mg by mouth daily.   fluticasone (FLONASE) 50 MCG/ACT nasal spray Place 2 sprays into both nostrils daily.   Loratadine (CLARITIN PO) Take 10 mg by mouth daily.   Multiple Vitamin (MULTI VITAMIN DAILY PO) 1 tablet daily.    multivitamin-lutein (OCUVITE-LUTEIN) CAPS capsule Take 1 capsule by mouth daily.   Probiotic Product (PROBIOTIC DAILY PO) Take by mouth daily.   progesterone (PROMETRIUM) 100 MG capsule TAKE 1 CAPSULE EACH NIGHT AT BEDTIME   tolterodine (DETROL LA) 4 MG 24 hr capsule Take 4 mg by mouth daily.   [DISCONTINUED] amoxicillin-clavulanate (AUGMENTIN) 875-125 MG tablet Take 1 tablet by mouth 2 (two) times daily.   [DISCONTINUED] benzonatate (TESSALON) 100 MG capsule Take 1 capsule (100 mg total) by mouth 3 (three) times daily as needed for cough.   [DISCONTINUED] estradiol (ESTRACE) 2 MG tablet  (Patient not taking: Reported on 04/07/2021)   [DISCONTINUED] meloxicam (MOBIC) 15 MG tablet Take 1 tablet (15 mg total) by mouth daily. (Patient not taking: Reported on 04/07/2021)   [DISCONTINUED] oxybutynin (DITROPAN-XL) 10 MG 24 hr tablet Take 1 tablet by mouth daily. (Patient not taking: Reported on 08/27/2019)   Facility-Administered Medications Prior to Visit  Medication Dose Route Frequency Provider   betamethasone acetate-betamethasone sodium phosphate (CELESTONE)  injection 3 mg  3 mg Intra-articular Once Felecia Shelling, DPM    Review of Systems  Constitutional:  Negative for appetite change and fatigue.  Respiratory:  Negative for chest tightness and shortness of breath.   Cardiovascular:  Negative for chest pain.  Gastrointestinal:  Negative for abdominal pain, nausea and vomiting.  Psychiatric/Behavioral:  Negative for agitation, decreased concentration and sleep disturbance. The patient is not nervous/anxious.        Objective    BP  121/77 (BP Location: Left Arm, Patient Position: Sitting, Cuff Size: Large)   Pulse 71   Temp 98.4 F (36.9 C) (Oral)   Resp 16   Ht 5\' 6"  (1.676 m)   Wt 231 lb 3.2 oz (104.9 kg)   BMI 37.32 kg/m  BP Readings from Last 3 Encounters:  02/09/22 121/77  10/05/20 133/74  08/27/19 (!) 143/91   Wt Readings from Last 3 Encounters:  02/09/22 231 lb 3.2 oz (104.9 kg)  04/07/21 221 lb (100.2 kg)  10/05/20 227 lb (103 kg)      Physical Exam Vitals reviewed.  Constitutional:      General: She is not in acute distress.    Appearance: Normal appearance. She is normal weight. She is not ill-appearing, toxic-appearing or diaphoretic.     Comments: Well groomed, calmly sitting in exam rooom  Eyes:     Conjunctiva/sclera: Conjunctivae normal.  Cardiovascular:     Rate and Rhythm: Normal rate and regular rhythm.     Pulses: Normal pulses.     Heart sounds: Normal heart sounds. No murmur heard.    No friction rub. No gallop.  Pulmonary:     Effort: Pulmonary effort is normal. No respiratory distress.     Breath sounds: Normal breath sounds. No stridor. No wheezing, rhonchi or rales.  Abdominal:     General: Bowel sounds are normal. There is no distension.     Palpations: Abdomen is soft.     Tenderness: There is no abdominal tenderness.  Musculoskeletal:     Left forearm: Deformity present.     Right lower leg: No edema.     Left lower leg: No edema.  Skin:    Findings: No erythema or rash.  Neurological:     Mental Status: She is alert and oriented to person, place, and time.  Psychiatric:        Attention and Perception: Attention and perception normal. She is attentive. She does not perceive auditory or visual hallucinations.        Mood and Affect: Mood and affect normal. Mood is not anxious or depressed. Affect is not labile, flat or tearful.        Speech: Speech normal. She is communicative. Speech is not delayed.        Behavior: Behavior normal. Behavior is not agitated,  slowed, withdrawn or hyperactive. Behavior is cooperative.        Thought Content: Thought content normal. Thought content is not paranoid or delusional. Thought content does not include homicidal or suicidal ideation. Thought content does not include homicidal or suicidal plan.        Judgment: Judgment normal.     Comments: Describes mood as "joyful"      No results found for any visits on 02/09/22.  Assessment & Plan     Problem List Items Addressed This Visit       Respiratory   OSA (obstructive sleep apnea)    Patient sleeps with CPAP and is requesting RX for  updated machinery  She states that she will call office once she is able to find the name of the supply company          Other   Depression, major, single episode, complete remission (HCC) - Primary    Remains in remission  Symptoms controlled on Cymbalta No medications changes today  Patient to follow up in 6 months for CPE Denies SI/HI       Need for influenza vaccination    Influenza vaccine was administered today  Patient tolerated well        Relevant Orders   Flu Vaccine QUAD 13mo+IM (Fluarix, Fluzone & Alfiuria Quad PF) (Completed)     Return in about 6 months (around 08/10/2022) for CPE.      I, Ronnald Ramp, MD, have reviewed all documentation for this visit.  Portions of this information were initially documented by the CMA and reviewed by me for thoroughness and accuracy.      Ronnald Ramp, MD  [facility] [phone] (phone) [phone] (fax)  [facility]

## 2022-02-08 DIAGNOSIS — F325 Major depressive disorder, single episode, in full remission: Secondary | ICD-10-CM | POA: Insufficient documentation

## 2022-02-09 ENCOUNTER — Encounter: Payer: Self-pay | Admitting: Family Medicine

## 2022-02-09 ENCOUNTER — Ambulatory Visit: Payer: Medicare PPO | Admitting: Family Medicine

## 2022-02-09 VITALS — BP 121/77 | HR 71 | Temp 98.4°F | Resp 16 | Ht 66.0 in | Wt 231.2 lb

## 2022-02-09 DIAGNOSIS — G4733 Obstructive sleep apnea (adult) (pediatric): Secondary | ICD-10-CM

## 2022-02-09 DIAGNOSIS — Z23 Encounter for immunization: Secondary | ICD-10-CM | POA: Diagnosis not present

## 2022-02-09 DIAGNOSIS — F325 Major depressive disorder, single episode, in full remission: Secondary | ICD-10-CM

## 2022-02-09 NOTE — Assessment & Plan Note (Signed)
Remains in remission  Symptoms controlled on Cymbalta No medications changes today  Patient to follow up in 6 months for CPE Denies SI/HI

## 2022-02-09 NOTE — Assessment & Plan Note (Signed)
Patient sleeps with CPAP and is requesting RX for updated machinery  She states that she will call office once she is able to find the name of the supply company

## 2022-02-09 NOTE — Patient Instructions (Signed)
   The CDC recommends two doses of Shingrix (the shingles vaccine) separated by 2 to 6 months for adults age 65 years and older. I recommend checking with your insurance plan regarding coverage for this vaccine.   

## 2022-02-09 NOTE — Assessment & Plan Note (Signed)
Influenza vaccine was administered today  Patient tolerated well   

## 2022-04-07 ENCOUNTER — Other Ambulatory Visit: Payer: Self-pay | Admitting: Family Medicine

## 2022-04-07 DIAGNOSIS — F325 Major depressive disorder, single episode, in full remission: Secondary | ICD-10-CM

## 2022-04-07 NOTE — Telephone Encounter (Signed)
Requested medication (s) are due for refill today: yes  Requested medication (s) are on the active medication list: yes  Last refill:  01/07/22 #90/0  Future visit scheduled: yes  Notes to clinic:  Unable to refill per protocol due to failed labs, no updated results.    Requested Prescriptions  Pending Prescriptions Disp Refills   DULoxetine (CYMBALTA) 60 MG capsule [Pharmacy Med Name: DULOXETINE HCL DR 60 MG CAP] 90 capsule 0    Sig: TAKE 1 CAPSULE BY MOUTH EVERY DAY     Psychiatry: Antidepressants - SNRI - duloxetine Failed - 04/07/2022  1:45 AM      Failed - Cr in normal range and within 360 days    Creatinine, Ser  Date Value Ref Range Status  10/05/2020 0.79 0.57 - 1.00 mg/dL Final         Failed - eGFR is 30 or above and within 360 days    GFR calc Af Amer  Date Value Ref Range Status  03/12/2008  >60 mL/min Final   >60        The eGFR has been calculated using the MDRD equation. This calculation has not been validated in all clinical situations. eGFR's persistently <60 mL/min signify possible Chronic Kidney Disease.   GFR calc non Af Amer  Date Value Ref Range Status  03/12/2008 >60 >60 mL/min Final   eGFR  Date Value Ref Range Status  10/05/2020 84 >59 mL/min/1.73 Final         Passed - Completed PHQ-2 or PHQ-9 in the last 360 days      Passed - Last BP in normal range    BP Readings from Last 1 Encounters:  02/09/22 121/77         Passed - Valid encounter within last 6 months    Recent Outpatient Visits           1 month ago Depression, major, single episode, complete remission ([place])   [place] Simmons-Robinson, [place], [place]   1 year ago Annual physical exam   [place] Eulas Post, MD   2 years ago Leg pain, diffuse, right   [place] Eulas Post, MD   2 years ago Depression, major, single episode, complete remission [facility])   [employer] Eulas Post, MD   6 years ago Acute pansinusitis, recurrence not specified   [place], Clearnce Sorrel, [place]       Future Appointments             In 4 months Simmons-Robinson, Riki Sheer, MD [facility], [place]

## 2022-04-11 ENCOUNTER — Telehealth: Payer: Self-pay

## 2022-04-11 NOTE — Telephone Encounter (Signed)
Left Message - left msg to rtn call to practice to do AWV   Left Message - i left msg for pt to call our office to r/s appt if unavailable.

## 2022-05-16 ENCOUNTER — Telehealth: Payer: Self-pay | Admitting: Family Medicine

## 2022-05-16 NOTE — Telephone Encounter (Signed)
Called patient to schedule Medicare Annual Wellness Visit (AWV). Left message for patient to call back and schedule Medicare Annual Wellness Visit (AWV).  Last date of AWV: 04/08/2022  Please schedule an appointment at any time with [facility].  If any questions, please contact me at [phone].  Thank you ,  Bernice Cicero Care Guide [facility] AWV TEAM Direct Dial: [phone]    

## 2022-06-09 LAB — LAB REPORT - SCANNED
A1c: 5.8
Creatinine, POC: 41 mg/dL
HM HIV Screening: NEGATIVE

## 2022-07-06 ENCOUNTER — Other Ambulatory Visit: Payer: Self-pay | Admitting: Family Medicine

## 2022-07-06 DIAGNOSIS — F325 Major depressive disorder, single episode, in full remission: Secondary | ICD-10-CM

## 2022-07-14 DIAGNOSIS — M9902 Segmental and somatic dysfunction of thoracic region: Secondary | ICD-10-CM | POA: Diagnosis not present

## 2022-07-14 DIAGNOSIS — M9903 Segmental and somatic dysfunction of lumbar region: Secondary | ICD-10-CM | POA: Diagnosis not present

## 2022-07-14 DIAGNOSIS — M5416 Radiculopathy, lumbar region: Secondary | ICD-10-CM | POA: Diagnosis not present

## 2022-07-14 DIAGNOSIS — M9901 Segmental and somatic dysfunction of cervical region: Secondary | ICD-10-CM | POA: Diagnosis not present

## 2022-07-18 DIAGNOSIS — M9901 Segmental and somatic dysfunction of cervical region: Secondary | ICD-10-CM | POA: Diagnosis not present

## 2022-07-18 DIAGNOSIS — M9902 Segmental and somatic dysfunction of thoracic region: Secondary | ICD-10-CM | POA: Diagnosis not present

## 2022-07-18 DIAGNOSIS — M5416 Radiculopathy, lumbar region: Secondary | ICD-10-CM | POA: Diagnosis not present

## 2022-07-18 DIAGNOSIS — M9903 Segmental and somatic dysfunction of lumbar region: Secondary | ICD-10-CM | POA: Diagnosis not present

## 2022-07-20 DIAGNOSIS — M9901 Segmental and somatic dysfunction of cervical region: Secondary | ICD-10-CM | POA: Diagnosis not present

## 2022-07-20 DIAGNOSIS — M5416 Radiculopathy, lumbar region: Secondary | ICD-10-CM | POA: Diagnosis not present

## 2022-07-20 DIAGNOSIS — M9903 Segmental and somatic dysfunction of lumbar region: Secondary | ICD-10-CM | POA: Diagnosis not present

## 2022-07-20 DIAGNOSIS — M9902 Segmental and somatic dysfunction of thoracic region: Secondary | ICD-10-CM | POA: Diagnosis not present

## 2022-07-28 DIAGNOSIS — M9902 Segmental and somatic dysfunction of thoracic region: Secondary | ICD-10-CM | POA: Diagnosis not present

## 2022-07-28 DIAGNOSIS — M9903 Segmental and somatic dysfunction of lumbar region: Secondary | ICD-10-CM | POA: Diagnosis not present

## 2022-07-28 DIAGNOSIS — M5416 Radiculopathy, lumbar region: Secondary | ICD-10-CM | POA: Diagnosis not present

## 2022-07-28 DIAGNOSIS — M9901 Segmental and somatic dysfunction of cervical region: Secondary | ICD-10-CM | POA: Diagnosis not present

## 2022-08-08 DIAGNOSIS — M9902 Segmental and somatic dysfunction of thoracic region: Secondary | ICD-10-CM | POA: Diagnosis not present

## 2022-08-08 DIAGNOSIS — M9903 Segmental and somatic dysfunction of lumbar region: Secondary | ICD-10-CM | POA: Diagnosis not present

## 2022-08-08 DIAGNOSIS — M9901 Segmental and somatic dysfunction of cervical region: Secondary | ICD-10-CM | POA: Diagnosis not present

## 2022-08-08 DIAGNOSIS — M5416 Radiculopathy, lumbar region: Secondary | ICD-10-CM | POA: Diagnosis not present

## 2022-08-09 DIAGNOSIS — M9902 Segmental and somatic dysfunction of thoracic region: Secondary | ICD-10-CM | POA: Diagnosis not present

## 2022-08-09 DIAGNOSIS — M9901 Segmental and somatic dysfunction of cervical region: Secondary | ICD-10-CM | POA: Diagnosis not present

## 2022-08-09 DIAGNOSIS — M5416 Radiculopathy, lumbar region: Secondary | ICD-10-CM | POA: Diagnosis not present

## 2022-08-09 DIAGNOSIS — M9903 Segmental and somatic dysfunction of lumbar region: Secondary | ICD-10-CM | POA: Diagnosis not present

## 2022-08-15 ENCOUNTER — Encounter: Payer: Self-pay | Admitting: Family Medicine

## 2022-08-15 ENCOUNTER — Ambulatory Visit: Payer: Medicare PPO | Admitting: Family Medicine

## 2022-08-15 VITALS — BP 125/73 | HR 67 | Resp 13 | Ht 66.0 in | Wt 235.6 lb

## 2022-08-15 DIAGNOSIS — I10 Essential (primary) hypertension: Secondary | ICD-10-CM

## 2022-08-15 DIAGNOSIS — G4733 Obstructive sleep apnea (adult) (pediatric): Secondary | ICD-10-CM | POA: Diagnosis not present

## 2022-08-15 DIAGNOSIS — Z780 Asymptomatic menopausal state: Secondary | ICD-10-CM | POA: Diagnosis not present

## 2022-08-15 DIAGNOSIS — M9903 Segmental and somatic dysfunction of lumbar region: Secondary | ICD-10-CM | POA: Diagnosis not present

## 2022-08-15 DIAGNOSIS — M9902 Segmental and somatic dysfunction of thoracic region: Secondary | ICD-10-CM | POA: Diagnosis not present

## 2022-08-15 DIAGNOSIS — R7303 Prediabetes: Secondary | ICD-10-CM

## 2022-08-15 DIAGNOSIS — F419 Anxiety disorder, unspecified: Secondary | ICD-10-CM

## 2022-08-15 DIAGNOSIS — M5416 Radiculopathy, lumbar region: Secondary | ICD-10-CM | POA: Diagnosis not present

## 2022-08-15 DIAGNOSIS — Z0000 Encounter for general adult medical examination without abnormal findings: Secondary | ICD-10-CM

## 2022-08-15 DIAGNOSIS — M9901 Segmental and somatic dysfunction of cervical region: Secondary | ICD-10-CM | POA: Diagnosis not present

## 2022-08-15 MED ORDER — BUSPIRONE HCL 7.5 MG PO TABS: 7.5000 mg | ORAL_TABLET | Freq: Two times a day (BID) | ORAL | 1 refills | Status: DC

## 2022-08-15 NOTE — Patient Instructions (Addendum)
Vaccines We discussed today  -Pneumonia Vaccine  -Shingrix Vaccine (shingles) -Td (tetanus booster)  -COVID booster    We have ordered your Bone Density scan (DEXA)   [facility] at [facility] [address] [place],  [place]  [address]  Main: [phone]

## 2022-08-15 NOTE — Progress Notes (Signed)
I,Vanessa  Vital,acting as a Neurosurgeon for [employer], MD.,have documented all relevant documentation on the behalf of Ronnald Ramp, MD,as directed by  Ronnald Ramp, MD while in the presence of Ronnald Ramp, MD.  Established patient visit   Patient: Anne Waters   DOB: 1956-02-06   67 y.o. Female  MRN: [id number] Visit Date: 08/15/2022  Today's healthcare provider: Ronnald Ramp, MD   Chief Complaint  Patient presents with   Hypertension   Depression    Patient states that her anxiety has been higher lately.   Obstructive Sleep Apnea   Subjective     HPI     Depression    Additional comments: Patient states that her anxiety has been higher lately.      Last edited by Lubertha Basque, CMA on 08/15/2022  1:12 PM.      Discussed the use of AI scribe software for clinical note transcription with the patient, who gave verbal consent to proceed.  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 She describes her anxiety as a feeling of tension and nervousness, which is exacerbated on the days she is primarily responsible for her mother's care. The patient also reports that she has been unable to enjoy her relationship with her mother due to the caregiving responsibilities.   OSA W/ CPAP The patient has been using a CPAP machine for an unspecified duration, which she reports has significantly improved her sleep. However, she has noticed that the machine seems to require daily refilling of the water for humidity. She expresses interest in obtaining a prescription for a new CPAP machine.  The patient also reports recent weight gain and decreased physical activity due to back pain and leg pain, which she attributes to spinal vertebrae issues. She is currently under the care of a specialist for these issues and  has a treatment plan in place involving multiple visits per week over the next two to three months. She notes that her inability to exercise as she normally would has contributed to her weight gain and increased anxiety.  The patient is currently taking Cymbalta at night for her anxiety. She has also been on estrogen and progesterone for menopausal symptoms. She has had blood work done in May, the results of which were reviewed during the visit. The patient's A1c was noted to be 5.8, which is in the prediabetes range.         Medications: Outpatient Medications Prior to Visit  Medication Sig   DULoxetine (CYMBALTA) 60 MG capsule TAKE 1 CAPSULE BY MOUTH EVERY DAY   estradiol (ESTRACE) 1 MG tablet Take 1 mg by mouth daily.   Loratadine (CLARITIN PO) Take 10 mg by mouth daily.   Multiple Vitamin (MULTI VITAMIN DAILY PO) 1 tablet daily.    multivitamin-lutein (OCUVITE-LUTEIN) CAPS capsule Take 1 capsule by mouth daily.   Probiotic Product (PROBIOTIC DAILY PO) Take by mouth daily.   progesterone (PROMETRIUM) 100 MG capsule TAKE 1 CAPSULE EACH NIGHT AT BEDTIME   tolterodine (DETROL LA) 4 MG 24 hr capsule Take 4 mg by mouth daily.   fluticasone (FLONASE) 50 MCG/ACT nasal spray Place 2 sprays into both nostrils daily.   Facility-Administered Medications Prior to Visit  Medication Dose Route Frequency Provider   betamethasone acetate-betamethasone sodium phosphate (CELESTONE) injection 3 mg  3 mg Intra-articular Once Felecia Shelling, DPM    Review of Systems  Objective    BP 125/73 (BP Location: Left Arm, Patient Position: Sitting, Cuff Size: Normal)   Pulse 67   Resp 13   Ht 5\' 6"  (1.676 m)   Wt 235 lb 9.6 oz (106.9 kg)   SpO2 96%   BMI 38.03 kg/m    Physical Exam Vitals reviewed.  Constitutional:      General: She is not in acute distress.    Appearance: Normal appearance. She is normal weight. She is not ill-appearing, toxic-appearing or diaphoretic.     Comments: Well  groomed, calmly sitting in exam rooom  Eyes:     Conjunctiva/sclera: Conjunctivae normal.  Cardiovascular:     Rate and Rhythm: Normal rate and regular rhythm.     Pulses: Normal pulses.     Heart sounds: Normal heart sounds. No murmur heard.    No friction rub. No gallop.  Pulmonary:     Effort: Pulmonary effort is normal. No respiratory distress.     Breath sounds: Normal breath sounds. No stridor. No wheezing, rhonchi or rales.  Abdominal:     General: Bowel sounds are normal. There is no distension.     Palpations: Abdomen is soft.     Tenderness: There is no abdominal tenderness.  Musculoskeletal:     Right lower leg: No edema.     Left lower leg: No edema.  Skin:    Findings: No erythema or rash.  Neurological:     Mental Status: She is alert and oriented to person, place, and time.  Psychiatric:        Attention and Perception: Attention and perception normal. She is attentive. She does not perceive auditory or visual hallucinations.        Mood and Affect: Mood and affect normal.        Speech: Speech normal.        Behavior: Behavior normal. Behavior is cooperative.        Thought Content: Thought content normal. Thought content is not paranoid or delusional. Thought content does not include homicidal or suicidal ideation. Thought content does not include homicidal or suicidal plan.        Judgment: Judgment normal.     Comments: Describes mood as mellow        No results found for any visits on 08/15/22.  Assessment & Plan     Problem List Items Addressed This Visit     Essential (primary) hypertension   OSA (obstructive sleep apnea)    Chronic Using CPAP machine nightly with significant improvement in sleep quality. Concerns about potential leakage and need for frequent water refills. -Request new CPAP machine prescription w/ DME order and refer for updated sleep study       Relevant Orders   Ambulatory referral to Pulmonology   Anxiety - Primary     Chronic Increased anxiety levels due to caregiving responsibilities for elderly mother and husband with heart disease. Currently on Cymbalta. -Start Buspirone 7.5mg  as needed for acute anxiety episodes, particularly on days when caregiving for mother. -Follow-up in 4-6 weeks to assess response to Buspirone.      Relevant Medications   busPIRone (BUSPAR) 7.5 MG tablet   Healthcare maintenance    -Schedule DEXA scan for bone density. -Obtain pneumonia vaccine at pharmacy. -Obtain tetanus vaccine update at pharmacy. -Obtain shingles vaccine at pharmacy. -Schedule Medicare wellness visit with nurse health advisor.       Prediabetes    Chronic  A1C at 5.8, indicating prediabetes. -Encourage diet modifications and  increased physical activity as able once back pain is managed.      Other Visit Diagnoses     Postmenopausal estrogen deficiency       Relevant Orders   DG Bone Density          Return in about 1 month (around 09/15/2022) for Mood.        The entirety of the information documented in the History of Present Illness, Review of Systems and Physical Exam were personally obtained by me. Portions of this information were initially documented by Lubertha Basque, CMA . I, Ronnald Ramp, MD have reviewed the documentation above for thoroughness and accuracy.     Ronnald Ramp, MD  [facility] [phone] (phone) [phone] (fax)  [facility]

## 2022-08-15 NOTE — Assessment & Plan Note (Signed)
Chronic Increased anxiety levels due to caregiving responsibilities for elderly mother and husband with heart disease. Currently on Cymbalta. -Start Buspirone 7.5mg  as needed for acute anxiety episodes, particularly on days when caregiving for mother. -Follow-up in 4-6 weeks to assess response to Buspirone.

## 2022-08-15 NOTE — Assessment & Plan Note (Signed)
Chronic Using CPAP machine nightly with significant improvement in sleep quality. Concerns about potential leakage and need for frequent water refills. -Request new CPAP machine prescription w/ DME order and refer for updated sleep study

## 2022-08-15 NOTE — Assessment & Plan Note (Deleted)
  Chronic Increased anxiety levels due to caregiving responsibilities for elderly mother and husband with heart disease. Currently on Cymbalta. -Start Buspirone 7.5mg  as needed for acute anxiety episodes, particularly on days when caregiving for mother. -Follow-up in 4-6 weeks to assess response to Buspirone.

## 2022-08-15 NOTE — Assessment & Plan Note (Signed)
-  Schedule DEXA scan for bone density. -Obtain pneumonia vaccine at pharmacy. -Obtain tetanus vaccine update at pharmacy. -Obtain shingles vaccine at pharmacy. -Schedule Medicare wellness visit with nurse health advisor.

## 2022-08-15 NOTE — Assessment & Plan Note (Signed)
Chronic  A1C at 5.8, indicating prediabetes. -Encourage diet modifications and increased physical activity as able once back pain is managed.

## 2022-08-17 DIAGNOSIS — M9901 Segmental and somatic dysfunction of cervical region: Secondary | ICD-10-CM | POA: Diagnosis not present

## 2022-08-17 DIAGNOSIS — M9903 Segmental and somatic dysfunction of lumbar region: Secondary | ICD-10-CM | POA: Diagnosis not present

## 2022-08-17 DIAGNOSIS — M5416 Radiculopathy, lumbar region: Secondary | ICD-10-CM | POA: Diagnosis not present

## 2022-08-17 DIAGNOSIS — M9902 Segmental and somatic dysfunction of thoracic region: Secondary | ICD-10-CM | POA: Diagnosis not present

## 2022-08-24 DIAGNOSIS — M9903 Segmental and somatic dysfunction of lumbar region: Secondary | ICD-10-CM | POA: Diagnosis not present

## 2022-08-24 DIAGNOSIS — M9901 Segmental and somatic dysfunction of cervical region: Secondary | ICD-10-CM | POA: Diagnosis not present

## 2022-08-24 DIAGNOSIS — M9902 Segmental and somatic dysfunction of thoracic region: Secondary | ICD-10-CM | POA: Diagnosis not present

## 2022-08-24 DIAGNOSIS — M5416 Radiculopathy, lumbar region: Secondary | ICD-10-CM | POA: Diagnosis not present

## 2022-08-25 DIAGNOSIS — M9901 Segmental and somatic dysfunction of cervical region: Secondary | ICD-10-CM | POA: Diagnosis not present

## 2022-08-25 DIAGNOSIS — M5416 Radiculopathy, lumbar region: Secondary | ICD-10-CM | POA: Diagnosis not present

## 2022-08-25 DIAGNOSIS — M9902 Segmental and somatic dysfunction of thoracic region: Secondary | ICD-10-CM | POA: Diagnosis not present

## 2022-08-25 DIAGNOSIS — M9903 Segmental and somatic dysfunction of lumbar region: Secondary | ICD-10-CM | POA: Diagnosis not present

## 2022-08-29 DIAGNOSIS — M9903 Segmental and somatic dysfunction of lumbar region: Secondary | ICD-10-CM | POA: Diagnosis not present

## 2022-08-29 DIAGNOSIS — M5416 Radiculopathy, lumbar region: Secondary | ICD-10-CM | POA: Diagnosis not present

## 2022-08-29 DIAGNOSIS — M9902 Segmental and somatic dysfunction of thoracic region: Secondary | ICD-10-CM | POA: Diagnosis not present

## 2022-08-29 DIAGNOSIS — M9901 Segmental and somatic dysfunction of cervical region: Secondary | ICD-10-CM | POA: Diagnosis not present

## 2022-08-30 DIAGNOSIS — M9902 Segmental and somatic dysfunction of thoracic region: Secondary | ICD-10-CM | POA: Diagnosis not present

## 2022-08-30 DIAGNOSIS — M9901 Segmental and somatic dysfunction of cervical region: Secondary | ICD-10-CM | POA: Diagnosis not present

## 2022-08-30 DIAGNOSIS — M9903 Segmental and somatic dysfunction of lumbar region: Secondary | ICD-10-CM | POA: Diagnosis not present

## 2022-08-30 DIAGNOSIS — M5416 Radiculopathy, lumbar region: Secondary | ICD-10-CM | POA: Diagnosis not present

## 2022-09-06 ENCOUNTER — Other Ambulatory Visit: Payer: Self-pay | Admitting: Family Medicine

## 2022-09-06 DIAGNOSIS — F419 Anxiety disorder, unspecified: Secondary | ICD-10-CM

## 2022-09-08 DIAGNOSIS — M5416 Radiculopathy, lumbar region: Secondary | ICD-10-CM | POA: Diagnosis not present

## 2022-09-08 DIAGNOSIS — M9902 Segmental and somatic dysfunction of thoracic region: Secondary | ICD-10-CM | POA: Diagnosis not present

## 2022-09-08 DIAGNOSIS — M9901 Segmental and somatic dysfunction of cervical region: Secondary | ICD-10-CM | POA: Diagnosis not present

## 2022-09-08 DIAGNOSIS — M9903 Segmental and somatic dysfunction of lumbar region: Secondary | ICD-10-CM | POA: Diagnosis not present

## 2022-09-09 DIAGNOSIS — M9903 Segmental and somatic dysfunction of lumbar region: Secondary | ICD-10-CM | POA: Diagnosis not present

## 2022-09-09 DIAGNOSIS — M5416 Radiculopathy, lumbar region: Secondary | ICD-10-CM | POA: Diagnosis not present

## 2022-09-09 DIAGNOSIS — M9901 Segmental and somatic dysfunction of cervical region: Secondary | ICD-10-CM | POA: Diagnosis not present

## 2022-09-09 DIAGNOSIS — M9902 Segmental and somatic dysfunction of thoracic region: Secondary | ICD-10-CM | POA: Diagnosis not present

## 2022-09-19 DIAGNOSIS — M9902 Segmental and somatic dysfunction of thoracic region: Secondary | ICD-10-CM | POA: Diagnosis not present

## 2022-09-19 DIAGNOSIS — M9901 Segmental and somatic dysfunction of cervical region: Secondary | ICD-10-CM | POA: Diagnosis not present

## 2022-09-19 DIAGNOSIS — M9903 Segmental and somatic dysfunction of lumbar region: Secondary | ICD-10-CM | POA: Diagnosis not present

## 2022-09-19 DIAGNOSIS — M5416 Radiculopathy, lumbar region: Secondary | ICD-10-CM | POA: Diagnosis not present

## 2022-09-21 ENCOUNTER — Ambulatory Visit: Payer: Medicare PPO | Admitting: Family Medicine

## 2022-09-21 DIAGNOSIS — D2261 Melanocytic nevi of right upper limb, including shoulder: Secondary | ICD-10-CM | POA: Diagnosis not present

## 2022-09-21 DIAGNOSIS — Z86006 Personal history of melanoma in-situ: Secondary | ICD-10-CM | POA: Diagnosis not present

## 2022-09-21 DIAGNOSIS — Z08 Encounter for follow-up examination after completed treatment for malignant neoplasm: Secondary | ICD-10-CM | POA: Diagnosis not present

## 2022-09-21 DIAGNOSIS — D2262 Melanocytic nevi of left upper limb, including shoulder: Secondary | ICD-10-CM | POA: Diagnosis not present

## 2022-09-21 DIAGNOSIS — D2271 Melanocytic nevi of right lower limb, including hip: Secondary | ICD-10-CM | POA: Diagnosis not present

## 2022-09-21 DIAGNOSIS — Z85820 Personal history of malignant melanoma of skin: Secondary | ICD-10-CM | POA: Diagnosis not present

## 2022-09-21 DIAGNOSIS — D225 Melanocytic nevi of trunk: Secondary | ICD-10-CM | POA: Diagnosis not present

## 2022-09-21 DIAGNOSIS — D2272 Melanocytic nevi of left lower limb, including hip: Secondary | ICD-10-CM | POA: Diagnosis not present

## 2022-09-21 DIAGNOSIS — L821 Other seborrheic keratosis: Secondary | ICD-10-CM | POA: Diagnosis not present

## 2022-09-23 DIAGNOSIS — M9902 Segmental and somatic dysfunction of thoracic region: Secondary | ICD-10-CM | POA: Diagnosis not present

## 2022-09-23 DIAGNOSIS — M5416 Radiculopathy, lumbar region: Secondary | ICD-10-CM | POA: Diagnosis not present

## 2022-09-23 DIAGNOSIS — M9903 Segmental and somatic dysfunction of lumbar region: Secondary | ICD-10-CM | POA: Diagnosis not present

## 2022-09-23 DIAGNOSIS — M9901 Segmental and somatic dysfunction of cervical region: Secondary | ICD-10-CM | POA: Diagnosis not present

## 2022-09-27 DIAGNOSIS — M5416 Radiculopathy, lumbar region: Secondary | ICD-10-CM | POA: Diagnosis not present

## 2022-09-27 DIAGNOSIS — M9902 Segmental and somatic dysfunction of thoracic region: Secondary | ICD-10-CM | POA: Diagnosis not present

## 2022-09-27 DIAGNOSIS — M9903 Segmental and somatic dysfunction of lumbar region: Secondary | ICD-10-CM | POA: Diagnosis not present

## 2022-09-27 DIAGNOSIS — M9901 Segmental and somatic dysfunction of cervical region: Secondary | ICD-10-CM | POA: Diagnosis not present

## 2022-09-28 ENCOUNTER — Ambulatory Visit (INDEPENDENT_AMBULATORY_CARE_PROVIDER_SITE_OTHER): Payer: Medicare PPO

## 2022-09-28 VITALS — Ht 67.0 in | Wt 235.0 lb

## 2022-09-28 DIAGNOSIS — Z0000 Encounter for general adult medical examination without abnormal findings: Secondary | ICD-10-CM | POA: Diagnosis not present

## 2022-09-28 DIAGNOSIS — Z1211 Encounter for screening for malignant neoplasm of colon: Secondary | ICD-10-CM

## 2022-09-28 NOTE — Progress Notes (Signed)
Subjective:   Anne Waters is a 67 y.o. female who presents for Medicare Annual (Subsequent) preventive examination.  Visit Complete: Virtual  I connected with  Anne Waters on 09/28/22 by a audio enabled telemedicine application and verified that I am speaking with the correct person using two identifiers.  Patient Location: Home  Provider Location: Office/Clinic  I discussed the limitations of evaluation and management by telemedicine. The patient expressed understanding and agreed to proceed.  Vital Signs: Unable to obtain new vitals due to this being a telehealth visit.  Patient Medicare AWV questionnaire was completed by the patient on 09/26/22; I have confirmed that all information answered by patient is correct and no changes since this date.  Review of Systems    Cardiac Risk Factors include: advanced age (>58men, >18 women);hypertension;obesity (BMI >30kg/m2)    Objective:    Today's Vitals   09/28/22 1112  Weight: 235 lb (106.6 kg)  Height: 5\' 7"  (1.702 m)   Body mass index is 36.81 kg/m.     09/28/2022   11:19 AM 04/07/2021   10:27 AM 04/01/2020   10:24 AM  Advanced Directives  Does Patient Have a Medical Advance Directive? Yes No Yes  Type of Estate agent of [place];Living will  Healthcare Power of [place];Living will  Copy of Healthcare Power of Attorney in Chart?   No - copy requested  Would patient like information on creating a medical advance directive?  No - Patient declined     Current Medications (verified) Outpatient Encounter Medications as of 09/28/2022  Medication Sig   busPIRone (BUSPAR) 7.5 MG tablet TAKE 1 TABLET BY MOUTH 2 TIMES DAILY.   DULoxetine (CYMBALTA) 60 MG capsule TAKE 1 CAPSULE BY MOUTH EVERY DAY   estradiol (ESTRACE) 1 MG tablet Take 1 mg by mouth daily.   Loratadine (CLARITIN PO) Take 10 mg by mouth daily.   Multiple Vitamin (MULTI VITAMIN DAILY PO) 1 tablet daily.    multivitamin-lutein  (OCUVITE-LUTEIN) CAPS capsule Take 1 capsule by mouth daily.   Probiotic Product (PROBIOTIC DAILY PO) Take by mouth daily.   progesterone (PROMETRIUM) 100 MG capsule TAKE 1 CAPSULE EACH NIGHT AT BEDTIME   tolterodine (DETROL LA) 4 MG 24 hr capsule Take 4 mg by mouth daily.   fluticasone (FLONASE) 50 MCG/ACT nasal spray Place 2 sprays into both nostrils daily.   Facility-Administered Encounter Medications as of 09/28/2022  Medication   betamethasone acetate-betamethasone sodium phosphate (CELESTONE) injection 3 mg    Allergies (verified) Hydrocodone-acetaminophen and Sulfa antibiotics   History: Past Medical History:  Diagnosis Date   Allergy    Anxiety 06/2010   PTSD   Arthritis ?   Cannot recall   Depression    Sleep apnea ?   Cannot recall   Past Surgical History:  Procedure Laterality Date   arm and hand surgery Bilateral    2010 left; 2011 right   Family History  Problem Relation Age of Onset   Cataracts Mother    Arthritis Mother    Macular degeneration Mother    Vision loss Mother    Heart attack Maternal Grandmother    Hypertension Maternal Grandmother    Hypertension Maternal Grandfather    CVA Maternal Grandfather    Alzheimer's disease Maternal Grandfather    CVA Paternal Grandmother    Alzheimer's disease Paternal Grandmother    Hypertension Paternal Grandmother    Heart attack Paternal Grandfather    Heart disease Father    Healthy Daughter  Healthy Daughter    Social History   Socioeconomic History   Marital status: Married    Spouse name: Not on file   Number of children: 2   Years of education: Not on file   Highest education level: Master's degree (e.g., MA, MS, MEng, MEd, MSW, MBA)  Occupational History   Not on file  Tobacco Use   Smoking status: Never   Smokeless tobacco: Never  Vaping Use   Vaping status: Never Used  Substance and Sexual Activity   Alcohol use: No    Comment: Previous heavy drinker- currently in AA   Drug use: No    Sexual activity: Yes    Birth control/protection: None    Comment: Post menopausal  Other Topics Concern   Not on file  Social History Narrative   Not on file   Social Determinants of Health   Financial Resource Strain: Low Risk  (09/26/2022)   Overall Financial Resource Strain (CARDIA)    Difficulty of Paying Living Expenses: Not hard at all  Food Insecurity: No Food Insecurity (09/26/2022)   Hunger Vital Sign    Worried About Running Out of Food in the Last Year: Never true    Ran Out of Food in the Last Year: Never true  Transportation Needs: No Transportation Needs (09/26/2022)   PRAPARE - Administrator, Civil Service (Medical): No    Lack of Transportation (Non-Medical): No  Physical Activity: Insufficiently Active (09/26/2022)   Exercise Vital Sign    Days of Exercise per Week: 3 days    Minutes of Exercise per Session: 30 min  Stress: Stress Concern Present (09/26/2022)   [employer] of Occupational Health - Occupational Stress Questionnaire    Feeling of Stress : To some extent  Social Connections: Socially Integrated (09/26/2022)   Social Connection and Isolation Panel [NHANES]    Frequency of Communication with Friends and Family: More than three times a week    Frequency of Social Gatherings with Friends and Family: More than three times a week    Attends Religious Services: More than 4 times per year    Active Member of [employer] or Organizations: Yes    Attends Engineer, structural: More than 4 times per year    Marital Status: Married    Tobacco Counseling Counseling given: Not Answered   Clinical Intake:  Pre-visit preparation completed: Yes  Pain : No/denies pain     BMI - recorded: 36.81 Nutritional Status: BMI > 30  Obese Nutritional Risks: None Diabetes: No  How often do you need to have someone help you when you read instructions, pamphlets, or other written materials from your doctor or pharmacy?: 1 - Never  Interpreter  Needed?: No  Comments: lives with husband Information entered by :: B.Abdulkareem Badolato,LPN   Activities of Daily Living    09/26/2022   10:00 PM 02/09/2022    1:25 PM  In your present state of health, do you have any difficulty performing the following activities:  Hearing? 0 0  Vision? 0 0  Difficulty concentrating or making decisions? 0 0  Walking or climbing stairs? 0 0  Dressing or bathing? 0 0  Doing errands, shopping? 0 0  Preparing Food and eating ? N   Using the Toilet? N   In the past six months, have you accidently leaked urine? Y   Do you have problems with loss of bowel control? N   Managing your Medications? N   Managing your Finances? N  Housekeeping or managing your Housekeeping? N     Patient Care Team: Ronnald Ramp, MD as PCP - General (Family Medicine) Debbrah Alar, MD (Dermatology) Freddy Finner, MD (Inactive) as Attending Physician (Obstetrics and Gynecology) Pa, [facility]  Indicate any recent Medical Services you may have received from other than [facility] providers in the past year (date may be approximate).     Assessment:   This is a routine wellness examination for [place].  Hearing/Vision screen Hearing Screening - Comments:: Adequate hearing Vision Screening - Comments:: Adequate vision readers only [facility]  Dr Jean Rosenthal  Dietary issues and exercise activities discussed:     Goals Addressed             This Visit's Progress    DIET - EAT MORE FRUITS AND VEGETABLES   On track    DIET - REDUCE CALORIE INTAKE   On track    Continue current diet plan of cutting back on fried foods, sugar and continue to eat 5 small meals a day.        Depression Screen    09/28/2022   11:16 AM 02/09/2022    1:24 PM 04/07/2021   10:26 AM 10/05/2020    1:18 PM 04/01/2020   10:21 AM  PHQ 2/9 Scores  PHQ - 2 Score 0 0 1 1 0  PHQ- 9 Score  4  4     Fall Risk    09/26/2022   10:00 PM 02/09/2022    1:24 PM 04/07/2021   10:28 AM  10/05/2020    1:19 PM 04/01/2020   10:24 AM  Fall Risk   Falls in the past year? 0 0 0 0 0  Number falls in past yr:  0 0 0 0  Injury with Fall?  0 0 0 0  Risk for fall due to : No Fall Risks No Fall Risks No Fall Risks No Fall Risks   Follow up Falls prevention discussed;Education provided Falls evaluation completed Falls evaluation completed Falls evaluation completed     MEDICARE RISK AT HOME: Medicare Risk at Home Any stairs in or around the home?: Yes If so, are there any without handrails?: No Home free of loose throw rugs in walkways, pet beds, electrical cords, etc?: Yes Adequate lighting in your home to reduce risk of falls?: Yes Life alert?: No Use of a cane, walker or w/c?: No Grab bars in the bathroom?: No Shower chair or bench in shower?: No Elevated toilet seat or a handicapped toilet?: No  TIMED UP AND GO:  Was the test performed?  No    Cognitive Function:        09/28/2022   11:26 AM 04/01/2020   10:31 AM  6CIT Screen  What Year? 0 points 0 points  What month? 0 points 0 points  What time? 0 points 0 points  Count back from 20 0 points 0 points  Months in reverse 0 points 0 points  Repeat phrase 0 points 0 points  Total Score 0 points 0 points    Immunizations Immunization History  Administered Date(s) Administered   Influenza,inj,Quad PF,6+ Mos 12/08/2019, 02/09/2022   Influenza-Unspecified 12/07/2014, 12/15/2016, 12/06/2017, 05/08/2019   PFIZER(Purple Top)SARS-COV-2 Vaccination 06/24/2019, 07/15/2019   Unspecified SARS-COV-2 Vaccination 06/24/2019    TDAP status: Due, Education has been provided regarding the importance of this vaccine. Advised may receive this vaccine at local pharmacy or Health Dept. Aware to provide a copy of the vaccination record if obtained from local  pharmacy or Health Dept. Verbalized acceptance and understanding.  Flu Vaccine status: Up to date  Pneumococcal vaccine status: Due, Education has been provided regarding the  importance of this vaccine. Advised may receive this vaccine at local pharmacy or Health Dept. Aware to provide a copy of the vaccination record if obtained from local pharmacy or Health Dept. Verbalized acceptance and understanding.  Covid-19 vaccine status: Completed vaccines  Qualifies for Shingles Vaccine? Yes   Zostavax completed No   Shingrix Completed?: No.    Education has been provided regarding the importance of this vaccine. Patient has been advised to call insurance company to determine out of pocket expense if they have not yet received this vaccine. Advised may also receive vaccine at local pharmacy or Health Dept. Verbalized acceptance and understanding.  Screening Tests Health Maintenance  Topic Date Due   DTaP/Tdap/Td (1 - Tdap) Never done   Zoster Vaccines- Shingrix (1 of 2) Never done   COVID-19 Vaccine (4 - 2023-24 season) 10/08/2021   Pneumonia Vaccine 90+ Years old (1 of 1 - PCV) Never done   DEXA SCAN  Never done   Fecal DNA (Cologuard)  09/03/2022   INFLUENZA VACCINE  09/08/2022   PAP SMEAR-Modifier  04/18/2023   Medicare Annual Wellness (AWV)  09/28/2023   MAMMOGRAM  10/15/2023   Hepatitis C Screening  Completed   HIV Screening  Completed   HPV VACCINES  Aged Out    Health Maintenance  Health Maintenance Due  Topic Date Due   DTaP/Tdap/Td (1 - Tdap) Never done   Zoster Vaccines- Shingrix (1 of 2) Never done   COVID-19 Vaccine (4 - 2023-24 season) 10/08/2021   Pneumonia Vaccine 13+ Years old (1 of 1 - PCV) Never done   DEXA SCAN  Never done   Fecal DNA (Cologuard)  09/03/2022   INFLUENZA VACCINE  09/08/2022    Colorectal cancer screening: Type of screening: Cologuard. Completed no. Repeat every 3 years  Mammogram status: Completed yes. Repeat every year Bone Density scheduled in October  Lung Cancer Screening: (Low Dose CT Chest recommended if Age 53-80 years, 20 pack-year currently smoking OR have quit w/in 15years.) does not qualify.   Lung  Cancer Screening Referral: no  Additional Screening:  Hepatitis C Screening: does not qualify; Completed yes  Vision Screening: Recommended annual ophthalmology exams for early detection of glaucoma and other disorders of the eye. Is the patient up to date with their annual eye exam?  Yes  Who is the provider or what is the name of the office in which the patient attends annual eye exams? Dr Jean Rosenthal If pt is not established with a provider, would they like to be referred to a provider to establish care? No .   Dental Screening: Recommended annual dental exams for proper oral hygiene  Diabetic Foot Exam: n/a  Community Resource Referral / Chronic Care Management: CRR required this visit?  No   CCM required this visit?  No     Plan:     I have personally reviewed and noted the following in the patient's chart:   Medical and social history Use of alcohol, tobacco or illicit drugs  Current medications and supplements including opioid prescriptions. Patient is not currently taking opioid prescriptions. Functional ability and status Nutritional status Physical activity Advanced directives List of other physicians Hospitalizations, surgeries, and ER visits in previous 12 months Vitals Screenings to include cognitive, depression, and falls Referrals and appointments  In addition, I have reviewed and discussed with patient  certain preventive protocols, quality metrics, and best practice recommendations. A written personalized care plan for preventive services as well as general preventive health recommendations were provided to patient.     Sue Lush, LPN   10/22/7827   After Visit Summary: (MyChart) Due to this being a telephonic visit, the after visit summary with patients personalized plan was offered to patient via MyChart   Nurse Notes: The patient states she is doing well. She relays concerns about her weight. She wants to lose weight and has been unsuccessful. Pt  desires to discuss weight management plan at visit w/PCP on 10/12/22. No other questions or concerns.

## 2022-09-28 NOTE — Patient Instructions (Signed)
Anne Waters , Thank you for taking time to come for your Medicare Wellness Visit. I appreciate your ongoing commitment to your health goals. Please review the following plan we discussed and let me know if I can assist you in the future.   Referrals/Orders/Follow-Ups/Clinician Recommendations: none  This is a list of the screening recommended for you and due dates:  Health Maintenance  Topic Date Due   DTaP/Tdap/Td vaccine (1 - Tdap) Never done   Zoster (Shingles) Vaccine (1 of 2) Never done   COVID-19 Vaccine (4 - 2023-24 season) 10/08/2021   Pneumonia Vaccine (1 of 1 - PCV) Never done   DEXA scan (bone density measurement)  Never done   Cologuard (Stool DNA test)  09/03/2022   Flu Shot  09/08/2022   Pap Smear  04/18/2023   Medicare Annual Wellness Visit  09/28/2023   Mammogram  10/15/2023   Hepatitis C Screening  Completed   HIV Screening  Completed   HPV Vaccine  Aged Out    Advanced directives: (Copy Requested) Please bring a copy of your health care power of attorney and living will to the office to be added to your chart at your convenience.  Next Medicare Annual Wellness Visit scheduled for next year: Yes

## 2022-09-30 ENCOUNTER — Ambulatory Visit: Payer: Medicare PPO | Admitting: Nurse Practitioner

## 2022-09-30 ENCOUNTER — Encounter: Payer: Self-pay | Admitting: Nurse Practitioner

## 2022-09-30 VITALS — BP 122/82 | HR 72 | Temp 97.3°F | Ht 67.0 in | Wt 228.8 lb

## 2022-09-30 DIAGNOSIS — M9901 Segmental and somatic dysfunction of cervical region: Secondary | ICD-10-CM | POA: Diagnosis not present

## 2022-09-30 DIAGNOSIS — M9903 Segmental and somatic dysfunction of lumbar region: Secondary | ICD-10-CM | POA: Diagnosis not present

## 2022-09-30 DIAGNOSIS — M9902 Segmental and somatic dysfunction of thoracic region: Secondary | ICD-10-CM | POA: Diagnosis not present

## 2022-09-30 DIAGNOSIS — G4733 Obstructive sleep apnea (adult) (pediatric): Secondary | ICD-10-CM

## 2022-09-30 DIAGNOSIS — M5416 Radiculopathy, lumbar region: Secondary | ICD-10-CM | POA: Diagnosis not present

## 2022-09-30 NOTE — Patient Instructions (Addendum)
Continue to use CPAP every night, minimum of 4-6 hours a night.  Change equipment as directed. Wash your tubing with warm soap and water daily, hang to dry. Wash humidifier portion weekly. Use bottled, distilled water and change daily  Be aware of reduced alertness and do not drive or operate heavy machinery if experiencing this or drowsiness.  Exercise encouraged, as tolerated. Notify if persistent daytime sleepiness occurs even with consistent use of CPAP.  Orders sent for a new machine to [facility]. You should hear something in the next 2-3 weeks from them  Follow up in 10-12 weeks with Dr. Belia Heman or Philis Nettle, or sooner, if needed. Ok to do video visit if needed.

## 2022-09-30 NOTE — Progress Notes (Unsigned)
@Patient  ID: Anne Waters, female    DOB: [birth year], 67 y.o.   MRN: [id number]  Chief Complaint  Patient presents with   Consult    Last sleep study 2016. On Cpap. Needs new machine. Mask and pressure are good.    Referring provider: Brett Albino*  HPI: 67 year old female, never smoker referred for sleep consult.  Past medical history significant for OSA on CPAP, hypertension, depression, anxiety, prediabetes.  TEST/EVENTS:  08/28/2014 PSG: AHI 6.7/h, SpO2 low 85%  09/30/2022: Today - sleep consult  Patient presents today for sleep consult, referred by Dr. Neita Garnet. She has a history of mild sleep apnea, originally diagnosed in 2016.  She has been on CPAP therapy since then.  She has a primary machine at home and then a travel CPAP machine that she uses whenever she goes on vacation and to visit her mother.  Sleeping well. Feels rested during the day.  No breakthrough snoring or issues with leakage.  She denies any drowsy driving, morning headaches or sleep parasomnia/paralysis.  Wears a nasal pillow mask.  She goes to bed between 11pm to midnight.  Usually falls asleep within 10 minutes.  Wakes once a night to use the restroom.  Gets up around 630 to 7:30 AM.  Weight is up 10 to 15 pounds over the last few years.  Current CPAP is set at 10 cm water.  No supplemental O2 use. She has a history of hypertension and prediabetes.  Well-controlled currently.  No history of stroke. She is a never smoker.  Does not drink alcohol.  Has 1 cup of coffee in the morning.  Lives with her husband.  She is retired.  No reported significant family history. Needs a new CPAP machine.  DME is [facility]  08/31/2022-09/29/2022: CPAP 10 cmH2O 22/30 days; 73% >4 hr; average use 7 hr 41 min Leaks 95th 6.3 AHI 1.1   Allergies  Allergen Reactions   Hydrocodone-Acetaminophen     Other reaction(s): VOMITING   Sulfa Antibiotics     Immunization History  Administered Date(s) Administered    Influenza,inj,Quad PF,6+ Mos 12/08/2019, 02/09/2022   Influenza-Unspecified 12/07/2014, 12/15/2016, 12/06/2017, 05/08/2019   PFIZER(Purple Top)SARS-COV-2 Vaccination 06/24/2019, 07/15/2019   Unspecified SARS-COV-2 Vaccination 06/24/2019    Past Medical History:  Diagnosis Date   Allergy    Anxiety 06/2010   PTSD   Arthritis ?   Cannot recall   Depression    Sleep apnea ?   Cannot recall    Tobacco History: Social History   Tobacco Use  Smoking Status Never  Smokeless Tobacco Never   Counseling given: Not Answered   Outpatient Medications Prior to Visit  Medication Sig Dispense Refill   busPIRone (BUSPAR) 7.5 MG tablet TAKE 1 TABLET BY MOUTH 2 TIMES DAILY. 180 tablet 0   DULoxetine (CYMBALTA) 60 MG capsule TAKE 1 CAPSULE BY MOUTH EVERY DAY 90 capsule 0   estradiol (ESTRACE) 1 MG tablet Take 1 mg by mouth daily.  2   Loratadine (CLARITIN PO) Take 10 mg by mouth daily.     Multiple Vitamin (MULTI VITAMIN DAILY PO) 1 tablet daily.      multivitamin-lutein (OCUVITE-LUTEIN) CAPS capsule Take 1 capsule by mouth daily.     Probiotic Product (PROBIOTIC DAILY PO) Take by mouth daily.     progesterone (PROMETRIUM) 100 MG capsule TAKE 1 CAPSULE EACH NIGHT AT BEDTIME  1   tolterodine (DETROL LA) 4 MG 24 hr capsule Take 4 mg by mouth daily.     fluticasone (  FLONASE) 50 MCG/ACT nasal spray Place 2 sprays into both nostrils daily. 16 g 6   Facility-Administered Medications Prior to Visit  Medication Dose Route Frequency Provider Last Rate Last Admin   betamethasone acetate-betamethasone sodium phosphate (CELESTONE) injection 3 mg  3 mg Intra-articular Once Felecia Shelling, DPM         Review of Systems:   Constitutional: No weight loss or gain, night sweats, fevers, chills, fatigue, or lassitude. HEENT: No headaches, difficulty swallowing, tooth/dental problems, or sore throat. No sneezing, itching, ear ache, nasal congestion, or post nasal drip CV:  No chest pain, orthopnea, PND,  swelling in lower extremities, anasarca, dizziness, palpitations, syncope Resp: No shortness of breath with exertion or at rest. No excess mucus or change in color of mucus. No productive or non-productive. No hemoptysis. No wheezing.  No chest wall deformity GI:  No heartburn, indigestion GU: No nocturia Skin: No rash, lesions, ulcerations MSK:  No joint pain or swelling.   Neuro: No dizziness or lightheadedness.  Psych: No depression or anxiety. Mood stable.     Physical Exam:  BP 122/82 (BP Location: Left Arm, Cuff Size: Large)   Pulse 72   Temp (!) 97.3 F (36.3 C)   Ht 5\' 7"  (1.702 m)   Wt 228 lb 12.8 oz (103.8 kg)   SpO2 99%   BMI 35.84 kg/m   GEN: Pleasant, interactive, well-appearing; obese; in no acute distress. HEENT:  Normocephalic and atraumatic. PERRLA. Sclera white. Nasal turbinates pink, moist and patent bilaterally. No rhinorrhea present. Oropharynx pink and moist, without exudate or edema. No lesions, ulcerations, or postnasal drip. Mallampati III NECK:  Supple w/ fair ROM. No JVD present. Normal carotid impulses w/o bruits. Thyroid symmetrical with no goiter or nodules palpated. No lymphadenopathy.   CV: RRR, no m/r/g, no peripheral edema. Pulses intact, +2 bilaterally. No cyanosis, pallor or clubbing. PULMONARY:  Unlabored, regular breathing. Clear bilaterally A&P w/o wheezes/rales/rhonchi. No accessory muscle use.  GI: BS present and normoactive. Soft, non-tender to palpation. No organomegaly or masses detected.  MSK: No erythema, warmth or tenderness. Cap refil <2 sec all extrem. No deformities or joint swelling noted.  Neuro: A/Ox3. No focal deficits noted.   Skin: Warm, no lesions or rashe Psych: Normal affect and behavior. Judgement and thought content appropriate.     Lab Results:  CBC    Component Value Date/Time   WBC 7.0 10/05/2020 1407   WBC 7.4 03/12/2008 0530   RBC 4.56 10/05/2020 1407   RBC 2.86 (L) 03/12/2008 0530   HGB 13.2 10/05/2020  1407   HCT 39.6 10/05/2020 1407   PLT 359 10/05/2020 1407   MCV 87 10/05/2020 1407   MCH 28.9 10/05/2020 1407   MCHC 33.3 10/05/2020 1407   MCHC 33.4 03/12/2008 0530   RDW 12.6 10/05/2020 1407   LYMPHSABS 1.8 10/05/2020 1407   EOSABS 0.3 10/05/2020 1407   BASOSABS 0.1 10/05/2020 1407    BMET    Component Value Date/Time   NA 143 10/05/2020 1407   K 4.6 10/05/2020 1407   CL 105 10/05/2020 1407   CO2 24 10/05/2020 1407   GLUCOSE 77 10/05/2020 1407   GLUCOSE 90 03/12/2008 0530   BUN 14 10/05/2020 1407   CREATININE 0.79 10/05/2020 1407   CALCIUM 9.8 10/05/2020 1407   GFRNONAA >60 03/12/2008 0530   GFRAA  03/12/2008 0530    >60        The eGFR has been calculated using the MDRD equation. This calculation has not  been validated in all clinical situations. eGFR's persistently <60 mL/min signify possible Chronic Kidney Disease.    BNP No results found for: "BNP"   Imaging:  No results found.  Administration History     None           No data to display          No results found for: "NITRICOXIDE"      Assessment & Plan:   OSA (obstructive sleep apnea) Mild OSA on CPAP. Excellent compliance per her report. Download shows 73% usage with average use 7 hr 41 min. The nights missing are ones she used her mini CPAP due to travel. Does not miss a night with her CPAP. She has excellent control on current settings. Receives benefit from use. Orders placed for new CPAP machine. Understands she will need to utilize this machine 70% of the nights >4 hrs for compliance purposes, which she is ok with taking it on her travels it needed. Aware of proper care/use. Safe driving practices reviewed.   Patient Instructions  Continue to use CPAP every night, minimum of 4-6 hours a night.  Change equipment as directed. Wash your tubing with warm soap and water daily, hang to dry. Wash humidifier portion weekly. Use bottled, distilled water and change daily  Be aware of  reduced alertness and do not drive or operate heavy machinery if experiencing this or drowsiness.  Exercise encouraged, as tolerated. Notify if persistent daytime sleepiness occurs even with consistent use of CPAP.  Orders sent for a new machine to [facility]. You should hear something in the next 2-3 weeks from them  Follow up in 10-12 weeks with Dr. Belia Heman or Philis Nettle, or sooner, if needed. Ok to do video visit if needed.    I spent 35 minutes of dedicated to the care of this patient on the date of this encounter to include pre-visit review of records, face-to-face time with the patient discussing conditions above, post visit ordering of testing, clinical documentation with the electronic health record, making appropriate referrals as documented, and communicating necessary findings to members of the patients care team.  Noemi Chapel, NP 10/06/2022  Pt aware and understands NP's role.

## 2022-10-03 DIAGNOSIS — M5416 Radiculopathy, lumbar region: Secondary | ICD-10-CM | POA: Diagnosis not present

## 2022-10-03 DIAGNOSIS — M9902 Segmental and somatic dysfunction of thoracic region: Secondary | ICD-10-CM | POA: Diagnosis not present

## 2022-10-03 DIAGNOSIS — M9903 Segmental and somatic dysfunction of lumbar region: Secondary | ICD-10-CM | POA: Diagnosis not present

## 2022-10-03 DIAGNOSIS — M9901 Segmental and somatic dysfunction of cervical region: Secondary | ICD-10-CM | POA: Diagnosis not present

## 2022-10-05 DIAGNOSIS — M5416 Radiculopathy, lumbar region: Secondary | ICD-10-CM | POA: Diagnosis not present

## 2022-10-05 DIAGNOSIS — M9903 Segmental and somatic dysfunction of lumbar region: Secondary | ICD-10-CM | POA: Diagnosis not present

## 2022-10-05 DIAGNOSIS — M9901 Segmental and somatic dysfunction of cervical region: Secondary | ICD-10-CM | POA: Diagnosis not present

## 2022-10-05 DIAGNOSIS — M9902 Segmental and somatic dysfunction of thoracic region: Secondary | ICD-10-CM | POA: Diagnosis not present

## 2022-10-06 ENCOUNTER — Encounter: Payer: Self-pay | Admitting: Nurse Practitioner

## 2022-10-06 NOTE — Assessment & Plan Note (Signed)
Mild OSA on CPAP. Excellent compliance per her report. Download shows 73% usage with average use 7 hr 41 min. The nights missing are ones she used her mini CPAP due to travel. Does not miss a night with her CPAP. She has excellent control on current settings. Receives benefit from use. Orders placed for new CPAP machine. Understands she will need to utilize this machine 70% of the nights >4 hrs for compliance purposes, which she is ok with taking it on her travels it needed. Aware of proper care/use. Safe driving practices reviewed.   Patient Instructions  Continue to use CPAP every night, minimum of 4-6 hours a night.  Change equipment as directed. Wash your tubing with warm soap and water daily, hang to dry. Wash humidifier portion weekly. Use bottled, distilled water and change daily  Be aware of reduced alertness and do not drive or operate heavy machinery if experiencing this or drowsiness.  Exercise encouraged, as tolerated. Notify if persistent daytime sleepiness occurs even with consistent use of CPAP.  Orders sent for a new machine to [facility]. You should hear something in the next 2-3 weeks from them  Follow up in 10-12 weeks with Dr. Belia Heman or Philis Nettle, or sooner, if needed. Ok to do video visit if needed.

## 2022-10-07 ENCOUNTER — Other Ambulatory Visit: Payer: Self-pay | Admitting: Physician Assistant

## 2022-10-07 DIAGNOSIS — F325 Major depressive disorder, single episode, in full remission: Secondary | ICD-10-CM

## 2022-10-07 NOTE — Telephone Encounter (Signed)
Requested Prescriptions  Pending Prescriptions Disp Refills   DULoxetine (CYMBALTA) 60 MG capsule [Pharmacy Med Name: DULOXETINE HCL DR 60 MG CAP] 90 capsule 0    Sig: TAKE 1 CAPSULE BY MOUTH EVERY DAY     Psychiatry: Antidepressants - SNRI - duloxetine Failed - 10/07/2022  2:35 AM      Failed - eGFR is 30 or above and within 360 days    GFR calc Af Amer  Date Value Ref Range Status  03/12/2008  >60 mL/min Final   >60        The eGFR has been calculated using the MDRD equation. This calculation has not been validated in all clinical situations. eGFR's persistently <60 mL/min signify possible Chronic Kidney Disease.   GFR calc non Af Amer  Date Value Ref Range Status  03/12/2008 >60 >60 mL/min Final   eGFR  Date Value Ref Range Status  10/05/2020 84 >59 mL/min/1.73 Final         Passed - Cr in normal range and within 360 days    Creatinine, Ser  Date Value Ref Range Status  10/05/2020 0.79 0.57 - 1.00 mg/dL Final   Creatinine, POC  Date Value Ref Range Status  06/09/2022 41.0 mg/dL Final    Comment:    Abstracted by HIM         Passed - Completed PHQ-2 or PHQ-9 in the last 360 days      Passed - Last BP in normal range    BP Readings from Last 1 Encounters:  09/30/22 122/82         Passed - Valid encounter within last 6 months    Recent Outpatient Visits           1 month ago Anxiety   [place] [facility], [place], MD   8 months ago Depression, major, single episode, complete remission [facility])   [facility] [facility] Simmons-Robinson, Tawanna Cooler, MD   2 years ago Annual physical exam   [place] [facility] Bosie Clos, MD   3 years ago Leg pain, diffuse, right   [facility] Bosie Clos, MD   3 years ago Depression, major, single episode, complete remission [facility])   [place] [facility] Bosie Clos, MD       Future  Appointments             In 1 month Simmons-Robinson, Tawanna Cooler, MD [facility]   In 2 months [facility], Ruby Cola, NP [place] [facility]

## 2022-10-12 ENCOUNTER — Ambulatory Visit: Payer: Medicare PPO | Admitting: Family Medicine

## 2022-10-12 DIAGNOSIS — Z1211 Encounter for screening for malignant neoplasm of colon: Secondary | ICD-10-CM | POA: Diagnosis not present

## 2022-10-14 DIAGNOSIS — M9903 Segmental and somatic dysfunction of lumbar region: Secondary | ICD-10-CM | POA: Diagnosis not present

## 2022-10-14 DIAGNOSIS — M9902 Segmental and somatic dysfunction of thoracic region: Secondary | ICD-10-CM | POA: Diagnosis not present

## 2022-10-14 DIAGNOSIS — M5416 Radiculopathy, lumbar region: Secondary | ICD-10-CM | POA: Diagnosis not present

## 2022-10-14 DIAGNOSIS — M9901 Segmental and somatic dysfunction of cervical region: Secondary | ICD-10-CM | POA: Diagnosis not present

## 2022-10-16 LAB — COLOGUARD: COLOGUARD: NEGATIVE

## 2022-10-18 DIAGNOSIS — H25813 Combined forms of age-related cataract, bilateral: Secondary | ICD-10-CM | POA: Diagnosis not present

## 2022-10-21 DIAGNOSIS — M5416 Radiculopathy, lumbar region: Secondary | ICD-10-CM | POA: Diagnosis not present

## 2022-10-21 DIAGNOSIS — M9903 Segmental and somatic dysfunction of lumbar region: Secondary | ICD-10-CM | POA: Diagnosis not present

## 2022-10-21 DIAGNOSIS — M9902 Segmental and somatic dysfunction of thoracic region: Secondary | ICD-10-CM | POA: Diagnosis not present

## 2022-10-21 DIAGNOSIS — M9901 Segmental and somatic dysfunction of cervical region: Secondary | ICD-10-CM | POA: Diagnosis not present

## 2022-10-26 DIAGNOSIS — M9902 Segmental and somatic dysfunction of thoracic region: Secondary | ICD-10-CM | POA: Diagnosis not present

## 2022-10-26 DIAGNOSIS — M9903 Segmental and somatic dysfunction of lumbar region: Secondary | ICD-10-CM | POA: Diagnosis not present

## 2022-10-26 DIAGNOSIS — M5416 Radiculopathy, lumbar region: Secondary | ICD-10-CM | POA: Diagnosis not present

## 2022-10-26 DIAGNOSIS — M9901 Segmental and somatic dysfunction of cervical region: Secondary | ICD-10-CM | POA: Diagnosis not present

## 2022-10-31 DIAGNOSIS — M9902 Segmental and somatic dysfunction of thoracic region: Secondary | ICD-10-CM | POA: Diagnosis not present

## 2022-10-31 DIAGNOSIS — M9903 Segmental and somatic dysfunction of lumbar region: Secondary | ICD-10-CM | POA: Diagnosis not present

## 2022-10-31 DIAGNOSIS — M5416 Radiculopathy, lumbar region: Secondary | ICD-10-CM | POA: Diagnosis not present

## 2022-10-31 DIAGNOSIS — M9901 Segmental and somatic dysfunction of cervical region: Secondary | ICD-10-CM | POA: Diagnosis not present

## 2022-11-04 ENCOUNTER — Encounter: Payer: Self-pay | Admitting: Family Medicine

## 2022-11-09 ENCOUNTER — Telehealth: Payer: Medicare PPO | Admitting: Physician Assistant

## 2022-11-09 DIAGNOSIS — M9902 Segmental and somatic dysfunction of thoracic region: Secondary | ICD-10-CM | POA: Diagnosis not present

## 2022-11-09 DIAGNOSIS — B9689 Other specified bacterial agents as the cause of diseases classified elsewhere: Secondary | ICD-10-CM

## 2022-11-09 DIAGNOSIS — J019 Acute sinusitis, unspecified: Secondary | ICD-10-CM | POA: Diagnosis not present

## 2022-11-09 DIAGNOSIS — M9903 Segmental and somatic dysfunction of lumbar region: Secondary | ICD-10-CM | POA: Diagnosis not present

## 2022-11-09 DIAGNOSIS — M9901 Segmental and somatic dysfunction of cervical region: Secondary | ICD-10-CM | POA: Diagnosis not present

## 2022-11-09 DIAGNOSIS — M5416 Radiculopathy, lumbar region: Secondary | ICD-10-CM | POA: Diagnosis not present

## 2022-11-09 MED ORDER — AMOXICILLIN-POT CLAVULANATE 875-125 MG PO TABS: 1.0000 | ORAL_TABLET | Freq: Two times a day (BID) | ORAL | 0 refills | Status: DC

## 2022-11-09 NOTE — Progress Notes (Signed)
I have spent 5 minutes in review of e-visit questionnaire, review and updating patient chart, medical decision making and response to patient.   Mia Milan Cody Jacklynn Dehaas, PA-C    

## 2022-11-09 NOTE — Progress Notes (Signed)

## 2022-11-11 ENCOUNTER — Ambulatory Visit: Payer: Medicare PPO | Admitting: Family Medicine

## 2022-11-15 ENCOUNTER — Telehealth: Payer: Medicare PPO | Admitting: Family Medicine

## 2022-11-16 ENCOUNTER — Ambulatory Visit: Payer: Medicare PPO | Admitting: Family Medicine

## 2022-11-21 ENCOUNTER — Ambulatory Visit
Admission: RE | Admit: 2022-11-21 | Discharge: 2022-11-21 | Disposition: A | Discharge status: Home / Self Care | Payer: Medicare PPO | Source: Ambulatory Visit | Attending: Family Medicine | Admitting: Family Medicine

## 2022-11-21 DIAGNOSIS — M85852 Other specified disorders of bone density and structure, left thigh: Secondary | ICD-10-CM | POA: Diagnosis not present

## 2022-11-21 DIAGNOSIS — Z780 Asymptomatic menopausal state: Secondary | ICD-10-CM | POA: Insufficient documentation

## 2022-12-06 ENCOUNTER — Encounter: Payer: Self-pay | Admitting: Family Medicine

## 2022-12-14 ENCOUNTER — Encounter: Payer: Self-pay | Admitting: Nurse Practitioner

## 2022-12-14 ENCOUNTER — Telehealth: Payer: Medicare PPO | Admitting: Nurse Practitioner

## 2022-12-14 DIAGNOSIS — G4733 Obstructive sleep apnea (adult) (pediatric): Secondary | ICD-10-CM | POA: Diagnosis not present

## 2022-12-14 DIAGNOSIS — M5416 Radiculopathy, lumbar region: Secondary | ICD-10-CM | POA: Diagnosis not present

## 2022-12-14 DIAGNOSIS — M9903 Segmental and somatic dysfunction of lumbar region: Secondary | ICD-10-CM | POA: Diagnosis not present

## 2022-12-14 DIAGNOSIS — M9902 Segmental and somatic dysfunction of thoracic region: Secondary | ICD-10-CM | POA: Diagnosis not present

## 2022-12-14 DIAGNOSIS — M9901 Segmental and somatic dysfunction of cervical region: Secondary | ICD-10-CM | POA: Diagnosis not present

## 2022-12-14 NOTE — Patient Instructions (Signed)
Continue to use CPAP every night, minimum of 4-6 hours a night.  Change equipment as directed. Wash your tubing with warm soap and water daily, hang to dry. Wash humidifier portion weekly. Use bottled, distilled water and change daily Be aware of reduced alertness and do not drive or operate heavy machinery if experiencing this or drowsiness.  Exercise encouraged, as tolerated. Healthy weight management discussed.  Avoid or decrease alcohol consumption and medications that make you more sleepy, if possible. Notify if persistent daytime sleepiness occurs even with consistent use of PAP therapy.  Follow up in one year with Katie Bradely Rudin,NP, or sooner, if needed

## 2022-12-14 NOTE — Assessment & Plan Note (Signed)
OSA on CPAP. Excellent compliance per her report. Awaiting download from the DME. Receives benefit from use. Understands proper care/use of device. Aware of risks of untreated OSA. Safe driving practices reviewed.   Patient Instructions  Continue to use CPAP every night, minimum of 4-6 hours a night.  Change equipment as directed. Wash your tubing with warm soap and water daily, hang to dry. Wash humidifier portion weekly. Use bottled, distilled water and change daily Be aware of reduced alertness and do not drive or operate heavy machinery if experiencing this or drowsiness.  Exercise encouraged, as tolerated. Healthy weight management discussed.  Avoid or decrease alcohol consumption and medications that make you more sleepy, if possible. Notify if persistent daytime sleepiness occurs even with consistent use of PAP therapy.  Follow up in one year with Anne Aila Terra,NP, or sooner, if needed

## 2022-12-14 NOTE — Progress Notes (Signed)
Patient ID: Anne Waters, female     DOB: [birth year], 67 y.o.      MRN: [id number]  Chief Complaint  Patient presents with   Follow-up    Using CPAP nightly.  Sleeping well.  Feels rested during the day.    Virtual Visit via Video Note  I connected with COBIE LEIDNER on 12/14/22 at  2:00 PM EST by a video enabled telemedicine application and verified that I am speaking with the correct person using two identifiers.  Location: Patient: Home Provider: Office   I discussed the limitations of evaluation and management by telemedicine and the availability of in person appointments. The patient expressed understanding and agreed to proceed.  History of Present Illness: 67 year old female, never smoker followed for OSA on CPAP.  Past medical history significant for OSA on CPAP, hypertension, depression, anxiety, prediabetes.   TEST/EVENTS:  08/28/2014 PSG: AHI 6.7/h, SpO2 low 85%   09/30/2022: OV with Markeese Boyajian NP for sleep consult, referred by Dr. Neita Garnet. She has a history of mild sleep apnea, originally diagnosed in 2016.  She has been on CPAP therapy since then.  She has a primary machine at home and then a travel CPAP machine that she uses whenever she goes on vacation and to visit her mother.  Sleeping well. Feels rested during the day.  No breakthrough snoring or issues with leakage.  She denies any drowsy driving, morning headaches or sleep parasomnia/paralysis.  Wears a nasal pillow mask.  She goes to bed between 11pm to midnight.  Usually falls asleep within 10 minutes.  Wakes once a night to use the restroom.  Gets up around 630 to 7:30 AM.  Weight is up 10 to 15 pounds over the last few years.  Current CPAP is set at 10 cm water.  No supplemental O2 use. She has a history of hypertension and prediabetes.  Well-controlled currently.  No history of stroke. She is a never smoker.  Does not drink alcohol.  Has 1 cup of coffee in the morning.  Lives with her husband.  She is  retired.  No reported significant family history. Needs a new CPAP machine.  DME is [facility]. 08/31/2022-09/29/2022: CPAP 10 cmH2O 22/30 days; 73% >4 hr; average use 7 hr 41 min Leaks 95th 6.3 AHI 1.1  12/14/2022: Today - follow up Patient presents today for follow up.  She got a new CPAP machine after her last visit.  She is wearing it nightly.  She loves her new CPAP machine.  Feels like it works better than her old one did.  Sleeps very well with it at night.  Energy levels are good during the day.  No issues with drowsy driving or morning headaches.  Download not available to review.  The medical supply company did not document the CPAP serial number.  The representative is following up on this and will tag is in her account once sorted out.  Allergies  Allergen Reactions   Hydrocodone-Acetaminophen     Other reaction(s): VOMITING   Sulfa Antibiotics    Immunization History  Administered Date(s) Administered   Influenza,inj,Quad PF,6+ Mos 12/08/2019, 02/09/2022   Influenza-Unspecified 12/07/2014, 12/15/2016, 12/06/2017, 05/08/2019, 12/06/2022   PFIZER(Purple Top)SARS-COV-2 Vaccination 06/24/2019, 07/15/2019   Unspecified SARS-COV-2 Vaccination 06/24/2019   Zoster Recombinant(Shingrix) 10/26/2022   Past Medical History:  Diagnosis Date   Allergy    Anxiety 06/2010   PTSD   Arthritis ?   Cannot recall   Depression    Sleep apnea ?  Cannot recall    Tobacco History: Social History   Tobacco Use  Smoking Status Never  Smokeless Tobacco Never   Counseling given: Not Answered   Outpatient Medications Prior to Visit  Medication Sig Dispense Refill   amoxicillin-clavulanate (AUGMENTIN) 875-125 MG tablet Take 1 tablet by mouth 2 (two) times daily. 14 tablet 0   busPIRone (BUSPAR) 7.5 MG tablet TAKE 1 TABLET BY MOUTH 2 TIMES DAILY. 180 tablet 0   DULoxetine (CYMBALTA) 60 MG capsule TAKE 1 CAPSULE BY MOUTH EVERY DAY 90 capsule 0   estradiol (ESTRACE) 1 MG tablet Take 1 mg by  mouth daily.  2   fluticasone (FLONASE) 50 MCG/ACT nasal spray Place 2 sprays into both nostrils daily. 16 g 6   Loratadine (CLARITIN PO) Take 10 mg by mouth daily.     Multiple Vitamin (MULTI VITAMIN DAILY PO) 1 tablet daily.      multivitamin-lutein (OCUVITE-LUTEIN) CAPS capsule Take 1 capsule by mouth daily.     Probiotic Product (PROBIOTIC DAILY PO) Take by mouth daily.     progesterone (PROMETRIUM) 100 MG capsule TAKE 1 CAPSULE EACH NIGHT AT BEDTIME  1   progesterone (PROMETRIUM) 100 MG capsule Take 100 mg by mouth daily.     tolterodine (DETROL LA) 4 MG 24 hr capsule Take 4 mg by mouth daily.     Facility-Administered Medications Prior to Visit  Medication Dose Route Frequency Provider Last Rate Last Admin   betamethasone acetate-betamethasone sodium phosphate (CELESTONE) injection 3 mg  3 mg Intra-articular Once Felecia Shelling, DPM         Review of Systems:   Constitutional: No weight loss or gain, night sweats, fevers, chills, fatigue, or lassitude. HEENT: No headaches, difficulty swallowing, tooth/dental problems, or sore throat. No sneezing, itching, ear ache, nasal congestion, or post nasal drip CV:  No chest pain, orthopnea, PND, swelling in lower extremities, anasarca, dizziness, palpitations, syncope Resp: No shortness of breath with exertion or at rest. No excess mucus or change in color of mucus. No productive or non-productive. No hemoptysis. No wheezing.  No chest wall deformity GI:  No heartburn, indigestion GU: No nocturia Skin: No rash, lesions, ulcerations MSK:  No joint pain or swelling.  Neuro: No dizziness or lightheadedness.  Psych: No depression or anxiety. Mood stable.   Observations/Objective: Patient is well-developed, well-nourished in no acute distress. A&Ox3. Resting comfortably at home. Unlabored breathing. Speech is clear and coherent with logical content.   Assessment and Plan: OSA (obstructive sleep apnea) OSA on CPAP. Excellent compliance per  her report. Awaiting download from the DME. Receives benefit from use. Understands proper care/use of device. Aware of risks of untreated OSA. Safe driving practices reviewed.   Patient Instructions  Continue to use CPAP every night, minimum of 4-6 hours a night.  Change equipment as directed. Wash your tubing with warm soap and water daily, hang to dry. Wash humidifier portion weekly. Use bottled, distilled water and change daily Be aware of reduced alertness and do not drive or operate heavy machinery if experiencing this or drowsiness.  Exercise encouraged, as tolerated. Healthy weight management discussed.  Avoid or decrease alcohol consumption and medications that make you more sleepy, if possible. Notify if persistent daytime sleepiness occurs even with consistent use of PAP therapy.  Follow up in one year with Katie Kenidy Crossland,NP, or sooner, if needed     I discussed the assessment and treatment plan with the patient. The patient was provided an opportunity to ask questions and all  were answered. The patient agreed with the plan and demonstrated an understanding of the instructions.   The patient was advised to call back or seek an in-person evaluation if the symptoms worsen or if the condition fails to improve as anticipated.  I provided 22 minutes of non-face-to-face time during this encounter.   Noemi Chapel, NP

## 2022-12-15 ENCOUNTER — Other Ambulatory Visit: Payer: Self-pay | Admitting: Family Medicine

## 2022-12-15 DIAGNOSIS — F419 Anxiety disorder, unspecified: Secondary | ICD-10-CM

## 2022-12-15 NOTE — Telephone Encounter (Signed)
Please advise 

## 2023-01-06 ENCOUNTER — Other Ambulatory Visit: Payer: Self-pay | Admitting: Family Medicine

## 2023-01-06 DIAGNOSIS — F325 Major depressive disorder, single episode, in full remission: Secondary | ICD-10-CM

## 2023-01-09 NOTE — Telephone Encounter (Signed)
Requested medication (s) are due for refill today: yes   Requested medication (s) are on the active medication list: yes   Last refill:  10/07/22 #90 0 refills  Future visit scheduled: no   Notes to clinic:  protocol failed. Last lab 03/12/2008 elevated. Do you want to refill Rx?     Requested Prescriptions  Pending Prescriptions Disp Refills   DULoxetine (CYMBALTA) 60 MG capsule [Pharmacy Med Name: DULOXETINE HCL DR 60 MG CAP] 90 capsule 0    Sig: TAKE 1 CAPSULE BY MOUTH EVERY DAY     Psychiatry: Antidepressants - SNRI - duloxetine Failed - 01/06/2023  2:59 AM      Failed - eGFR is 30 or above and within 360 days    GFR calc Af Amer  Date Value Ref Range Status  03/12/2008  >60 mL/min Final   >60        The eGFR has been calculated using the MDRD equation. This calculation has not been validated in all clinical situations. eGFR's persistently <60 mL/min signify possible Chronic Kidney Disease.   GFR calc non Af Amer  Date Value Ref Range Status  03/12/2008 >60 >60 mL/min Final   eGFR  Date Value Ref Range Status  10/05/2020 84 >59 mL/min/1.73 Final         Passed - Cr in normal range and within 360 days    Creatinine, Ser  Date Value Ref Range Status  10/05/2020 0.79 0.57 - 1.00 mg/dL Final   Creatinine, POC  Date Value Ref Range Status  06/09/2022 41.0 mg/dL Final    Comment:    Abstracted by HIM         Passed - Completed PHQ-2 or PHQ-9 in the last 360 days      Passed - Last BP in normal range    BP Readings from Last 1 Encounters:  09/30/22 122/82         Passed - Valid encounter within last 6 months    Recent Outpatient Visits           4 months ago Anxiety   [place] [facility] [place], [place], MD   11 months ago Depression, major, single episode, complete remission [facility])   [place] [facility] Simmons-Robinson, Tawanna Cooler, MD   2 years ago Annual physical exam   [place] [facility] Bosie Clos, MD   3 years ago Leg pain, diffuse, right   [facility] Bosie Clos, MD   3 years ago Depression, major, single episode, complete remission [facility])   [place] [facility] Bosie Clos, MD

## 2023-01-17 DIAGNOSIS — M5416 Radiculopathy, lumbar region: Secondary | ICD-10-CM | POA: Diagnosis not present

## 2023-01-17 DIAGNOSIS — M9902 Segmental and somatic dysfunction of thoracic region: Secondary | ICD-10-CM | POA: Diagnosis not present

## 2023-01-17 DIAGNOSIS — M9903 Segmental and somatic dysfunction of lumbar region: Secondary | ICD-10-CM | POA: Diagnosis not present

## 2023-01-17 DIAGNOSIS — M9901 Segmental and somatic dysfunction of cervical region: Secondary | ICD-10-CM | POA: Diagnosis not present

## 2023-01-18 DIAGNOSIS — N951 Menopausal and female climacteric states: Secondary | ICD-10-CM | POA: Diagnosis not present

## 2023-01-18 DIAGNOSIS — Z124 Encounter for screening for malignant neoplasm of cervix: Secondary | ICD-10-CM | POA: Diagnosis not present

## 2023-01-18 DIAGNOSIS — Z1151 Encounter for screening for human papillomavirus (HPV): Secondary | ICD-10-CM | POA: Diagnosis not present

## 2023-01-18 DIAGNOSIS — Z6838 Body mass index (BMI) 38.0-38.9, adult: Secondary | ICD-10-CM | POA: Diagnosis not present

## 2023-01-18 DIAGNOSIS — R32 Unspecified urinary incontinence: Secondary | ICD-10-CM | POA: Diagnosis not present

## 2023-01-18 DIAGNOSIS — N950 Postmenopausal bleeding: Secondary | ICD-10-CM | POA: Diagnosis not present

## 2023-02-03 DIAGNOSIS — M9901 Segmental and somatic dysfunction of cervical region: Secondary | ICD-10-CM | POA: Diagnosis not present

## 2023-02-03 DIAGNOSIS — M9902 Segmental and somatic dysfunction of thoracic region: Secondary | ICD-10-CM | POA: Diagnosis not present

## 2023-02-03 DIAGNOSIS — M5416 Radiculopathy, lumbar region: Secondary | ICD-10-CM | POA: Diagnosis not present

## 2023-02-03 DIAGNOSIS — M9903 Segmental and somatic dysfunction of lumbar region: Secondary | ICD-10-CM | POA: Diagnosis not present

## 2023-02-21 ENCOUNTER — Other Ambulatory Visit: Payer: Self-pay | Admitting: Family Medicine

## 2023-02-21 DIAGNOSIS — Z1231 Encounter for screening mammogram for malignant neoplasm of breast: Secondary | ICD-10-CM

## 2023-02-24 DIAGNOSIS — M5416 Radiculopathy, lumbar region: Secondary | ICD-10-CM | POA: Diagnosis not present

## 2023-02-24 DIAGNOSIS — M9901 Segmental and somatic dysfunction of cervical region: Secondary | ICD-10-CM | POA: Diagnosis not present

## 2023-02-24 DIAGNOSIS — M9903 Segmental and somatic dysfunction of lumbar region: Secondary | ICD-10-CM | POA: Diagnosis not present

## 2023-02-24 DIAGNOSIS — M9902 Segmental and somatic dysfunction of thoracic region: Secondary | ICD-10-CM | POA: Diagnosis not present

## 2023-03-08 DIAGNOSIS — N939 Abnormal uterine and vaginal bleeding, unspecified: Secondary | ICD-10-CM | POA: Diagnosis not present

## 2023-03-08 DIAGNOSIS — N950 Postmenopausal bleeding: Secondary | ICD-10-CM | POA: Diagnosis not present

## 2023-03-10 ENCOUNTER — Encounter: Payer: Self-pay | Admitting: Family Medicine

## 2023-03-10 NOTE — Telephone Encounter (Signed)
Pt received letter from [facility] stating Dr. Roxan Hockey would not be in network. Informed pt she could disregard letter and can continue to see Dr. Roxan Hockey. Pt verbalized understanding and states to disregard the [employer].

## 2023-03-13 ENCOUNTER — Ambulatory Visit
Admission: RE | Admit: 2023-03-13 | Discharge: 2023-03-13 | Disposition: A | Discharge status: Home / Self Care | Payer: Medicare PPO | Source: Ambulatory Visit | Attending: Family Medicine | Admitting: Family Medicine

## 2023-03-13 DIAGNOSIS — Z1231 Encounter for screening mammogram for malignant neoplasm of breast: Secondary | ICD-10-CM | POA: Insufficient documentation

## 2023-03-16 ENCOUNTER — Other Ambulatory Visit: Payer: Self-pay | Admitting: Family Medicine

## 2023-03-16 DIAGNOSIS — R928 Other abnormal and inconclusive findings on diagnostic imaging of breast: Secondary | ICD-10-CM

## 2023-03-22 ENCOUNTER — Ambulatory Visit
Admission: RE | Admit: 2023-03-22 | Discharge: 2023-03-22 | Discharge status: Home / Self Care | Payer: Medicare PPO | Source: Ambulatory Visit | Attending: Family Medicine

## 2023-03-22 ENCOUNTER — Ambulatory Visit
Admission: RE | Admit: 2023-03-22 | Discharge: 2023-03-22 | Disposition: A | Discharge status: Home / Self Care | Payer: Medicare PPO | Source: Ambulatory Visit | Attending: Family Medicine | Admitting: Family Medicine

## 2023-03-22 DIAGNOSIS — R928 Other abnormal and inconclusive findings on diagnostic imaging of breast: Secondary | ICD-10-CM

## 2023-03-22 DIAGNOSIS — R92323 Mammographic fibroglandular density, bilateral breasts: Secondary | ICD-10-CM | POA: Diagnosis not present

## 2023-03-23 ENCOUNTER — Other Ambulatory Visit: Payer: Medicare PPO

## 2023-03-23 ENCOUNTER — Encounter: Payer: Self-pay | Admitting: Family Medicine

## 2023-03-24 DIAGNOSIS — D2271 Melanocytic nevi of right lower limb, including hip: Secondary | ICD-10-CM | POA: Diagnosis not present

## 2023-03-24 DIAGNOSIS — D225 Melanocytic nevi of trunk: Secondary | ICD-10-CM | POA: Diagnosis not present

## 2023-03-24 DIAGNOSIS — Z85820 Personal history of malignant melanoma of skin: Secondary | ICD-10-CM | POA: Diagnosis not present

## 2023-03-24 DIAGNOSIS — D2261 Melanocytic nevi of right upper limb, including shoulder: Secondary | ICD-10-CM | POA: Diagnosis not present

## 2023-03-24 DIAGNOSIS — Z85828 Personal history of other malignant neoplasm of skin: Secondary | ICD-10-CM | POA: Diagnosis not present

## 2023-03-24 DIAGNOSIS — D2272 Melanocytic nevi of left lower limb, including hip: Secondary | ICD-10-CM | POA: Diagnosis not present

## 2023-03-24 DIAGNOSIS — D2262 Melanocytic nevi of left upper limb, including shoulder: Secondary | ICD-10-CM | POA: Diagnosis not present

## 2023-04-15 DIAGNOSIS — G4733 Obstructive sleep apnea (adult) (pediatric): Secondary | ICD-10-CM | POA: Diagnosis not present

## 2023-05-18 ENCOUNTER — Ambulatory Visit: Payer: Medicare PPO | Admitting: Nurse Practitioner

## 2023-05-29 ENCOUNTER — Encounter: Payer: Self-pay | Admitting: Family Medicine

## 2023-06-14 ENCOUNTER — Other Ambulatory Visit (HOSPITAL_COMMUNITY): Payer: Self-pay

## 2023-06-14 ENCOUNTER — Telehealth: Payer: Self-pay

## 2023-06-14 ENCOUNTER — Ambulatory Visit: Admitting: Family Medicine

## 2023-06-14 ENCOUNTER — Encounter: Payer: Self-pay | Admitting: Family Medicine

## 2023-06-14 VITALS — BP 134/65 | HR 77 | Ht 67.0 in | Wt 239.0 lb

## 2023-06-14 DIAGNOSIS — R7303 Prediabetes: Secondary | ICD-10-CM | POA: Diagnosis not present

## 2023-06-14 DIAGNOSIS — Z6837 Body mass index (BMI) 37.0-37.9, adult: Secondary | ICD-10-CM

## 2023-06-14 DIAGNOSIS — I10 Essential (primary) hypertension: Secondary | ICD-10-CM | POA: Diagnosis not present

## 2023-06-14 DIAGNOSIS — F325 Major depressive disorder, single episode, in full remission: Secondary | ICD-10-CM | POA: Diagnosis not present

## 2023-06-14 DIAGNOSIS — E66812 Obesity, class 2: Secondary | ICD-10-CM | POA: Diagnosis not present

## 2023-06-14 DIAGNOSIS — Z89112 Acquired absence of left hand: Secondary | ICD-10-CM | POA: Diagnosis not present

## 2023-06-14 DIAGNOSIS — G4733 Obstructive sleep apnea (adult) (pediatric): Secondary | ICD-10-CM

## 2023-06-14 DIAGNOSIS — F419 Anxiety disorder, unspecified: Secondary | ICD-10-CM

## 2023-06-14 MED ORDER — ZEPBOUND 5 MG/0.5ML ~~LOC~~ SOAJ
5.0000 mg | SUBCUTANEOUS | 1 refills | Status: DC
Start: 2023-06-14 — End: 2023-09-25

## 2023-06-14 MED ORDER — ZEPBOUND 2.5 MG/0.5ML ~~LOC~~ SOAJ
2.5000 mg | SUBCUTANEOUS | 0 refills | Status: DC
Start: 2023-06-14 — End: 2023-08-07

## 2023-06-14 NOTE — Assessment & Plan Note (Signed)
 Obesity Chronic obesity with a BMI of 37.43. Current weight is 239 pounds, increased from 228 pounds in August 2024. She is interested in weight loss treatment. Discussed potential medications, including semaglutide [facility]) and tirzepatide (Zepbound). Zepbound is preferred due to lower cost and insurance coverage. Discussed potential side effects of Zepbound, including nausea, constipation, and diarrhea. She qualifies for treatment with no personal or family history of medullary thyroid  cancer or pancreatitis. - Prescribe Zepbound 2.5 mg once weekly, increase to 5 mg as tolerated. - Schedule follow-up in six weeks to monitor progress and adjust treatment. - Encourage participation in [employer] and [employer] for weight management and exercise.

## 2023-06-14 NOTE — Progress Notes (Signed)
 Established patient visit   Patient: Anne Waters   DOB: 1955/10/07   68 y.o. Female  MRN: [id number] Visit Date: 06/14/2023  Today's healthcare provider: Mimi Alt, MD   Chief Complaint  Patient presents with   Medication    Discuss weight loss/treatment    Subjective     HPI     Medication    Additional comments: Discuss weight loss/treatment       Last edited by Bart Lieu, CMA on 06/14/2023  9:38 AM.       Discussed the use of AI scribe software for clinical note transcription with the patient, who gave verbal consent to proceed.  History of Present Illness Anne Waters is a 68 year old female with obesity and obstructive sleep apnea who presents for weight management discussion.  She has a history of obesity with a BMI of 37.43, and her current weight is 239 pounds, up from 228 pounds in August 2024. She is interested in weight loss treatments and has explored options with her insurance company. She has signed up for Weight Watchers and Silver Sneakers for weight management and exercise. She experiences fatigue, sweating, and joint pain, which she attributes to her weight. She is trying to cut out sugar, drinks approximately 80 ounces of water a day, and takes magnesium at night to help with constipation.  She has a history of obstructive sleep apnea, which is chronic and stable, and is improved with the use of CPAP every night. She uses the CPAP consistently, which has helped manage her condition.  She has a history of hypertension, which is chronic and stable. Her blood pressure is well controlled with a reading of 134/65 mmHg. She is not currently taking any medication for hypertension and is managing it through lifestyle efforts.  She has a history of anxiety and depression. She was previously prescribed buspirone  but has not been taking it due to side effects. She continues to take duloxetine  60 mg daily for her anxiety and  depression.  She is on hormone replacement therapy with Estrace 2 mg daily and progesterone 200 mg daily, managed by her obstetrician-gynecologist. She has a history of a thick cervix and bleeding issues if not on these medications. She has undergone multiple internal ultrasounds, a D&C, and a biopsy last summer. She has been on these medications for years to manage her menopausal symptoms and prevent cervical issues.  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 She feels physically and mentally unwell due to her weight and is seeking ways to improve her health.  No personal or family history of medullary thyroid  cancer and no history of pancreatitis.     Past Medical History:  Diagnosis Date   Allergy    Anxiety 06/2010   PTSD   Arthritis ?   Cannot recall   Depression    Sleep apnea ?   Cannot recall    Medications: Outpatient Medications Prior to Visit  Medication Sig   estradiol (ESTRACE) 2 MG tablet Take 2 mg by mouth daily.   progesterone (PROMETRIUM) 200 MG capsule Take 200 mg by mouth daily.   SHINGRIX injection    DULoxetine  (CYMBALTA ) 60 MG capsule TAKE 1 CAPSULE BY MOUTH EVERY DAY   fluticasone  (FLONASE ) 50 MCG/ACT nasal spray Place 2 sprays into both nostrils daily.   Loratadine (CLARITIN PO) Take 10 mg by mouth daily.  Multiple Vitamin (MULTI VITAMIN DAILY PO) 1 tablet daily.    multivitamin-lutein (OCUVITE-LUTEIN) CAPS capsule Take 1 capsule by mouth daily.   Probiotic Product (PROBIOTIC DAILY PO) Take by mouth daily.   tolterodine (DETROL LA) 4 MG 24 hr capsule Take 4 mg by mouth daily.   [DISCONTINUED] amoxicillin -clavulanate (AUGMENTIN ) 875-125 MG tablet Take 1 tablet by mouth 2 (two) times daily.   [DISCONTINUED] busPIRone  (BUSPAR ) 7.5 MG tablet TAKE 1 TABLET BY MOUTH TWICE A DAY   [DISCONTINUED] estradiol (ESTRACE) 1 MG tablet Take 2 mg by mouth daily.   [DISCONTINUED]  progesterone (PROMETRIUM) 100 MG capsule TAKE 1 CAPSULE EACH NIGHT AT BEDTIME   [DISCONTINUED] progesterone (PROMETRIUM) 100 MG capsule Take 200 mg by mouth daily.   Facility-Administered Medications Prior to Visit  Medication Dose Route Frequency Provider   betamethasone  acetate-betamethasone  sodium phosphate (CELESTONE ) injection 3 mg  3 mg Intra-articular Once Dot Gazella, DPM    Review of Systems  Last metabolic panel Lab Results  Component Value Date   GLUCOSE 77 10/05/2020   NA 143 10/05/2020   K 4.6 10/05/2020   CL 105 10/05/2020   CO2 24 10/05/2020   BUN 14 10/05/2020   CREATININE 0.79 10/05/2020   EGFR 84 10/05/2020   CALCIUM 9.8 10/05/2020   PROT 7.1 10/05/2020   ALBUMIN 4.5 10/05/2020   LABGLOB 2.6 10/05/2020   AGRATIO 1.7 10/05/2020   BILITOT 0.2 10/05/2020   ALKPHOS 86 10/05/2020   AST 19 10/05/2020   ALT 16 10/05/2020     Lab Results  Component Value Date   HGBA1C 5.6 10/05/2020       Objective    BP 134/65   Pulse 77   Ht 5\' 7"  (1.702 m)   Wt 239 lb (108.4 kg)   SpO2 98%   BMI 37.43 kg/m  BP Readings from Last 3 Encounters:  06/14/23 134/65  09/30/22 122/82  08/15/22 125/73   Wt Readings from Last 3 Encounters:  06/14/23 239 lb (108.4 kg)  09/30/22 228 lb 12.8 oz (103.8 kg)  09/28/22 235 lb (106.6 kg)        Physical Exam Vitals reviewed.  Constitutional:      General: She is not in acute distress.    Appearance: Normal appearance. She is not ill-appearing, toxic-appearing or diaphoretic.  Eyes:     Conjunctiva/sclera: Conjunctivae normal.  Cardiovascular:     Rate and Rhythm: Normal rate and regular rhythm.     Pulses: Normal pulses.     Heart sounds: Normal heart sounds. No murmur heard.    No friction rub. No gallop.  Pulmonary:     Effort: Pulmonary effort is normal. No respiratory distress.     Breath sounds: Normal breath sounds. No stridor. No wheezing, rhonchi or rales.  Abdominal:     General: Bowel sounds are  normal. There is no distension.     Palpations: Abdomen is soft.     Tenderness: There is no abdominal tenderness.  Musculoskeletal:     Right lower leg: No edema.     Left lower leg: No edema.     Comments: Left hand amputee   Skin:    Findings: No erythema or rash.  Neurological:     Mental Status: She is alert and oriented to person, place, and time.  Psychiatric:        Mood and Affect: Mood and affect normal.        Speech: Speech normal.        Behavior:  Behavior normal. Behavior is cooperative.     General: Alert, no acute distress Cardio: Normal S1 and S2, RRR, no r/m/g Pulm: CTAB, normal work of breathing ABD: soft, abdomen is not distended, there is no tenderness to palpation, normal BS  Extremities: no LE edema    No results found for any visits on 06/14/23.  Assessment & Plan     Problem List Items Addressed This Visit       Cardiovascular and Mediastinum   Essential (primary) hypertension - Primary   Hypertension, chronic  Well-controlled hypertension with blood pressure at 134/65 mmHg, managed with lifestyle efforts. - Continue lifestyle efforts to manage hypertension.        Respiratory   OSA (obstructive sleep apnea)   Chronic  Stable with CPAP use  Patient advised to continue CPAP nightly  The patient has had significant benefit from the use of CPAP machine with considerable improvements in quality of life, work production and decrease medical complaints.  The patient will need continued maintenance and care CPAP supplies (including upgrades as deemed appropriate) in order to continue treatment for sleep apnea as this is medically necessary.         Relevant Medications   tirzepatide (ZEPBOUND) 2.5 MG/0.5ML Pen   tirzepatide (ZEPBOUND) 5 MG/0.5ML Pen     Other   Prediabetes   History of amputation of left hand   Depression, major, single episode, complete remission (HCC)   Chronic Anxiety and depression managed with duloxetine  60 mg daily.  Previously prescribed buspirone  but discontinued due to side effects. - Continue duloxetine  60 mg daily.      Class 2 severe obesity with serious comorbidity and body mass index (BMI) of 37.0 to 37.9 in adult [facility])   Obesity Chronic obesity with a BMI of 37.43. Current weight is 239 pounds, increased from 228 pounds in August 2024. She is interested in weight loss treatment. Discussed potential medications, including semaglutide [facility]) and tirzepatide (Zepbound). Zepbound is preferred due to lower cost and insurance coverage. Discussed potential side effects of Zepbound, including nausea, constipation, and diarrhea. She qualifies for treatment with no personal or family history of medullary thyroid  cancer or pancreatitis. - Prescribe Zepbound 2.5 mg once weekly, increase to 5 mg as tolerated. - Schedule follow-up in six weeks to monitor progress and adjust treatment. - Encourage participation in [employer]chers and Silver Sneakers for weight management and exercise.      Relevant Medications   tirzepatide (ZEPBOUND) 2.5 MG/0.5ML Pen   tirzepatide (ZEPBOUND) 5 MG/0.5ML Pen   Anxiety    Assessment & Plan    Estrogen Deficiency, Menopausal Symptoms Managed by OBGYN with estradiol and progesterone therapy. History of thick cervix and bleeding managed with hormone therapy. Previous discussions of hysterectomy as a last resort. Continues follow-up with OBGYN. - Continue estradiol and progesterone as prescribed by OBGYN. - Continue follow-up with OBGYN for management of menopausal symptoms.     Return in about 6 weeks (around 07/26/2023) for Weight MGMT.         Mimi Alt, MD  [facility] [phone] (phone) [phone] (fax)  [facility]

## 2023-06-14 NOTE — Assessment & Plan Note (Signed)
 Hypertension, chronic  Well-controlled hypertension with blood pressure at 134/65 mmHg, managed with lifestyle efforts. - Continue lifestyle efforts to manage hypertension.

## 2023-06-14 NOTE — Assessment & Plan Note (Signed)
Chronic  Stable with CPAP use  Patient advised to continue CPAP nightly  The patient has had significant benefit from the use of CPAP machine with considerable improvements in quality of life, work production and decrease medical complaints.  The patient will need continued maintenance and care CPAP supplies (including upgrades as deemed appropriate) in order to continue treatment for sleep apnea as this is medically necessary.     

## 2023-06-14 NOTE — Assessment & Plan Note (Signed)
 Chronic Anxiety and depression managed with duloxetine  60 mg daily. Previously prescribed buspirone  but discontinued due to side effects. - Continue duloxetine  60 mg daily.

## 2023-06-14 NOTE — Telephone Encounter (Signed)
 Pharmacy Patient Advocate Encounter   Received notification from Onbase that prior authorization for Zepbound 2.5MG /0.5ML pen-injectors is required/requested.   Insurance verification completed.   The patient is insured through [employer] .   Per test claim: PA required; PA submitted to above mentioned insurance via CoverMyMeds Key/confirmation #/[id number] Status is pending

## 2023-06-15 ENCOUNTER — Other Ambulatory Visit (HOSPITAL_COMMUNITY): Payer: Self-pay

## 2023-06-15 NOTE — Telephone Encounter (Signed)
 Pharmacy Patient Advocate Encounter  Received notification from [employer] that Prior Authorization for Zepbound 2.5MG /0.5ML pen-injectors has been APPROVED from 02/08/23 to 02/07/24. Unable to obtain price due to refill too soon rejection, last fill date 06/14/23 next available fill date05/28/25   PA #/Case ID/Reference #: [id number]

## 2023-07-02 ENCOUNTER — Other Ambulatory Visit: Payer: Self-pay | Admitting: Family Medicine

## 2023-07-02 DIAGNOSIS — F325 Major depressive disorder, single episode, in full remission: Secondary | ICD-10-CM

## 2023-07-06 DIAGNOSIS — M5416 Radiculopathy, lumbar region: Secondary | ICD-10-CM | POA: Diagnosis not present

## 2023-07-06 DIAGNOSIS — M9902 Segmental and somatic dysfunction of thoracic region: Secondary | ICD-10-CM | POA: Diagnosis not present

## 2023-07-06 DIAGNOSIS — M9903 Segmental and somatic dysfunction of lumbar region: Secondary | ICD-10-CM | POA: Diagnosis not present

## 2023-07-06 DIAGNOSIS — M9901 Segmental and somatic dysfunction of cervical region: Secondary | ICD-10-CM | POA: Diagnosis not present

## 2023-07-26 DIAGNOSIS — M5416 Radiculopathy, lumbar region: Secondary | ICD-10-CM | POA: Diagnosis not present

## 2023-07-26 DIAGNOSIS — M9903 Segmental and somatic dysfunction of lumbar region: Secondary | ICD-10-CM | POA: Diagnosis not present

## 2023-07-26 DIAGNOSIS — M9902 Segmental and somatic dysfunction of thoracic region: Secondary | ICD-10-CM | POA: Diagnosis not present

## 2023-07-26 DIAGNOSIS — M9901 Segmental and somatic dysfunction of cervical region: Secondary | ICD-10-CM | POA: Diagnosis not present

## 2023-08-07 ENCOUNTER — Encounter: Payer: Self-pay | Admitting: Family Medicine

## 2023-08-07 ENCOUNTER — Ambulatory Visit: Admitting: Family Medicine

## 2023-08-07 VITALS — BP 137/80 | HR 76 | Ht 67.0 in | Wt 224.0 lb

## 2023-08-07 DIAGNOSIS — E66812 Obesity, class 2: Secondary | ICD-10-CM

## 2023-08-07 DIAGNOSIS — Z6837 Body mass index (BMI) 37.0-37.9, adult: Secondary | ICD-10-CM

## 2023-08-07 MED ORDER — ZEPBOUND 7.5 MG/0.5ML ~~LOC~~ SOAJ: 7.5000 mg | SUBCUTANEOUS | 1 refills | Status: DC

## 2023-08-07 NOTE — Assessment & Plan Note (Signed)
 Obesity Chronic, improving She has been on Zepbound  5 mg weekly for a month, resulting in a weight loss of approximately 15 pounds and a BMI reduction from 38 to 35. She reports improved portion control, reduced appetite, and increased energy levels, but experiences increased hunger by the end of the week before her next dose. She is concerned about potential nausea with dose escalation. She has not been exercising due to previous nausea and fatigue but plans to start walking regularly. - Continue Zepbound  5 mg weekly until the current supply is exhausted. - Increase Zepbound  to 7.5 mg weekly after the current supply is finished, based on current tolerance and potential to extend appetite suppression. - Encourage regular physical activity, such as walking for 30-45 minutes daily. - Schedule follow-up appointment for end of September to reassess weight management and medication tolerance.

## 2023-08-07 NOTE — Progress Notes (Signed)
 Established patient visit   Patient: Anne Waters   DOB: November 07, 1955   68 y.o. Female  MRN: [id number] Visit Date: 08/07/2023  Today's healthcare provider: Rockie Agent, MD   Chief Complaint  Patient presents with   Weight Loss   Subjective       Discussed the use of AI scribe software for clinical note transcription with the patient, who gave verbal consent to proceed.  History of Present Illness Anne Waters is a 68 year old female who presents for weight management and follow-up on semaglutide treatment.  She has been on semaglutide for weight management, resulting in a weight loss from 239.9 pounds to 223 pounds. She started the medication on May 12th and has been on a 5 mg dose once a week for a month. She experiences nausea for the first two to three days after the injection, which has improved over time. Mild constipation is managed with prunes, MiraLAX, and coffee, with bowel movements every two days, which is normal for her.  She reports significant changes in her appetite, describing a reduction in 'food noise' and improved portion control. She consumes about 80 ounces of water daily and has cut out fried foods, although she allows herself small portions of sweets occasionally. She notes increased energy levels and plans to incorporate more structured walking into her routine, having already experienced increased activity during a recent family beach trip.  She mentions feeling hungry by Friday after her Sunday night injection, which she manages with willpower and water intake. She administers the injection in her abdomen, alternating sides. She has tried niacin to help with nausea and is cautious about taking additional medications. She has a 90-day supply of her current dose.  Her family history includes a 55 year old mother, indicating longevity in the family. She recently went on a family trip with 50 family members, indicating a supportive  family environment.     Past Medical History:  Diagnosis Date   Allergy    Anxiety 06/2010   PTSD   Arthritis ?   Cannot recall   Depression    Sleep apnea ?   Cannot recall    Medications: Outpatient Medications Prior to Visit  Medication Sig   DULoxetine  (CYMBALTA ) 60 MG capsule TAKE 1 CAPSULE BY MOUTH EVERY DAY   estradiol (ESTRACE) 2 MG tablet Take 2 mg by mouth daily.   fluticasone  (FLONASE ) 50 MCG/ACT nasal spray Place 2 sprays into both nostrils daily.   Loratadine (CLARITIN PO) Take 10 mg by mouth daily.   Multiple Vitamin (MULTI VITAMIN DAILY PO) 1 tablet daily.    multivitamin-lutein (OCUVITE-LUTEIN) CAPS capsule Take 1 capsule by mouth daily.   Probiotic Product (PROBIOTIC DAILY PO) Take by mouth daily.   progesterone (PROMETRIUM) 200 MG capsule Take 200 mg by mouth daily.   SHINGRIX injection    tirzepatide  (ZEPBOUND ) 5 MG/0.5ML Pen Inject 5 mg into the skin once a week.   tolterodine (DETROL LA) 4 MG 24 hr capsule Take 4 mg by mouth daily.   [DISCONTINUED] tirzepatide  (ZEPBOUND ) 2.5 MG/0.5ML Pen Inject 2.5 mg into the skin once a week.   Facility-Administered Medications Prior to Visit  Medication Dose Route Frequency Provider   betamethasone  acetate-betamethasone  sodium phosphate (CELESTONE ) injection 3 mg  3 mg Intra-articular Once Evans, Brent M, DPM    Review of Systems  Last metabolic panel Lab Results  Component Value Date   GLUCOSE 77 10/05/2020   NA 143 10/05/2020   K 4.6  10/05/2020   CL 105 10/05/2020   CO2 24 10/05/2020   BUN 14 10/05/2020   CREATININE 0.79 10/05/2020   EGFR 84 10/05/2020   CALCIUM 9.8 10/05/2020   PROT 7.1 10/05/2020   ALBUMIN 4.5 10/05/2020   LABGLOB 2.6 10/05/2020   AGRATIO 1.7 10/05/2020   BILITOT 0.2 10/05/2020   ALKPHOS 86 10/05/2020   AST 19 10/05/2020   ALT 16 10/05/2020   Last lipids Lab Results  Component Value Date   CHOL 190 10/05/2020   HDL 51 10/05/2020   LDLCALC 118 (H) 10/05/2020   TRIG 116  10/05/2020   CHOLHDL 3.7 10/05/2020   Last hemoglobin A1c Lab Results  Component Value Date   HGBA1C 5.6 10/05/2020   Last thyroid  functions Lab Results  Component Value Date   TSH 2.790 10/05/2020        Objective    BP 137/80   Pulse 76   Ht 5' 7 (1.702 m)   Wt 224 lb (101.6 kg)   SpO2 95%   BMI 35.08 kg/m  BP Readings from Last 3 Encounters:  08/07/23 137/80  06/14/23 134/65  09/30/22 122/82   Wt Readings from Last 3 Encounters:  08/07/23 224 lb (101.6 kg)  06/14/23 239 lb (108.4 kg)  09/30/22 228 lb 12.8 oz (103.8 kg)        Physical Exam Vitals reviewed.  Constitutional:      General: She is not in acute distress.    Appearance: Normal appearance. She is not ill-appearing.   Cardiovascular:     Rate and Rhythm: Normal rate and regular rhythm.  Pulmonary:     Effort: Pulmonary effort is normal. No respiratory distress.     Breath sounds: No wheezing, rhonchi or rales.   Musculoskeletal:     Comments: Left hand amputee    Neurological:     Mental Status: She is alert and oriented to person, place, and time.   Psychiatric:        Mood and Affect: Mood normal.        Behavior: Behavior normal.       No results found for any visits on 08/07/23.  Assessment & Plan     Problem List Items Addressed This Visit       Other   Class 2 severe obesity with serious comorbidity and body mass index (BMI) of 37.0 to 37.9 in adult [facility]) - Primary   Obesity Chronic, improving She has been on Zepbound  5 mg weekly for a month, resulting in a weight loss of approximately 15 pounds and a BMI reduction from 38 to 35. She reports improved portion control, reduced appetite, and increased energy levels, but experiences increased hunger by the end of the week before her next dose. She is concerned about potential nausea with dose escalation. She has not been exercising due to previous nausea and fatigue but plans to start walking regularly. - Continue Zepbound  5 mg  weekly until the current supply is exhausted. - Increase Zepbound  to 7.5 mg weekly after the current supply is finished, based on current tolerance and potential to extend appetite suppression. - Encourage regular physical activity, such as walking for 30-45 minutes daily. - Schedule follow-up appointment for end of September to reassess weight management and medication tolerance.      Relevant Medications   tirzepatide  (ZEPBOUND ) 7.5 MG/0.5ML Pen (Start on 10/02/2023)    Assessment & Plan   Nausea She experienced nausea for the first few days after starting Zepbound , which has since  subsided. She is concerned about the potential return of nausea with an increased dose of Zepbound  She has tried nausea OTC to help manage nausea. - Monitor for nausea with current and increased dose of Zepbound   Constipation She reports constipation, which she manages with prunes, MiraLAX, and coffee. Her bowel movements occur approximately every two days, which is her normal pattern. - Continue current management with prunes, MiraLAX, and coffee as needed.  General Health Maintenance She has made significant lifestyle changes, including improved portion control, increased water intake, and reduced consumption of sweets and fried foods. She plans to incorporate regular walking into her routine. - Encourage continued healthy eating habits and portion control. - Promote regular physical activity, such as walking.     Return in about 3 months (around 11/07/2023) for CHRONIC F/U, Weight MGMT.         Rockie Agent, MD  [facility] [phone] (phone) [phone] (fax)  [facility]

## 2023-08-08 DIAGNOSIS — M9903 Segmental and somatic dysfunction of lumbar region: Secondary | ICD-10-CM | POA: Diagnosis not present

## 2023-08-08 DIAGNOSIS — M9901 Segmental and somatic dysfunction of cervical region: Secondary | ICD-10-CM | POA: Diagnosis not present

## 2023-08-08 DIAGNOSIS — M5416 Radiculopathy, lumbar region: Secondary | ICD-10-CM | POA: Diagnosis not present

## 2023-08-08 DIAGNOSIS — M9902 Segmental and somatic dysfunction of thoracic region: Secondary | ICD-10-CM | POA: Diagnosis not present

## 2023-08-15 DIAGNOSIS — M9902 Segmental and somatic dysfunction of thoracic region: Secondary | ICD-10-CM | POA: Diagnosis not present

## 2023-08-15 DIAGNOSIS — M9903 Segmental and somatic dysfunction of lumbar region: Secondary | ICD-10-CM | POA: Diagnosis not present

## 2023-08-15 DIAGNOSIS — M5416 Radiculopathy, lumbar region: Secondary | ICD-10-CM | POA: Diagnosis not present

## 2023-08-15 DIAGNOSIS — M9901 Segmental and somatic dysfunction of cervical region: Secondary | ICD-10-CM | POA: Diagnosis not present

## 2023-08-30 DIAGNOSIS — M9903 Segmental and somatic dysfunction of lumbar region: Secondary | ICD-10-CM | POA: Diagnosis not present

## 2023-08-30 DIAGNOSIS — M9901 Segmental and somatic dysfunction of cervical region: Secondary | ICD-10-CM | POA: Diagnosis not present

## 2023-08-30 DIAGNOSIS — M5416 Radiculopathy, lumbar region: Secondary | ICD-10-CM | POA: Diagnosis not present

## 2023-08-30 DIAGNOSIS — M9902 Segmental and somatic dysfunction of thoracic region: Secondary | ICD-10-CM | POA: Diagnosis not present

## 2023-09-13 ENCOUNTER — Encounter: Payer: Self-pay | Admitting: Family Medicine

## 2023-09-13 DIAGNOSIS — M9902 Segmental and somatic dysfunction of thoracic region: Secondary | ICD-10-CM | POA: Diagnosis not present

## 2023-09-13 DIAGNOSIS — M5416 Radiculopathy, lumbar region: Secondary | ICD-10-CM | POA: Diagnosis not present

## 2023-09-13 DIAGNOSIS — M9903 Segmental and somatic dysfunction of lumbar region: Secondary | ICD-10-CM | POA: Diagnosis not present

## 2023-09-13 DIAGNOSIS — M9901 Segmental and somatic dysfunction of cervical region: Secondary | ICD-10-CM | POA: Diagnosis not present

## 2023-09-14 ENCOUNTER — Other Ambulatory Visit: Payer: Self-pay | Admitting: Family Medicine

## 2023-09-14 DIAGNOSIS — R110 Nausea: Secondary | ICD-10-CM

## 2023-09-14 MED ORDER — ONDANSETRON HCL 4 MG PO TABS: 4.0000 mg | ORAL_TABLET | Freq: Every day | ORAL | 0 refills | Status: DC | PRN

## 2023-09-22 DIAGNOSIS — G4733 Obstructive sleep apnea (adult) (pediatric): Secondary | ICD-10-CM | POA: Diagnosis not present

## 2023-09-25 ENCOUNTER — Ambulatory Visit: Admitting: Urology

## 2023-09-25 ENCOUNTER — Encounter: Payer: Self-pay | Admitting: Urology

## 2023-09-25 VITALS — BP 131/79 | HR 77 | Ht 66.0 in | Wt 212.8 lb

## 2023-09-25 DIAGNOSIS — N3946 Mixed incontinence: Secondary | ICD-10-CM

## 2023-09-25 LAB — MICROSCOPIC EXAMINATION: Bacteria, UA: NONE SEEN

## 2023-09-25 LAB — URINALYSIS, COMPLETE
Bilirubin, UA: NEGATIVE
Glucose, UA: NEGATIVE
Ketones, UA: NEGATIVE
Leukocytes,UA: NEGATIVE
Nitrite, UA: NEGATIVE
Protein,UA: NEGATIVE
RBC, UA: NEGATIVE
Specific Gravity, UA: 1.01 (ref 1.005–1.030)
Urobilinogen, Ur: 0.2 mg/dL (ref 0.2–1.0)
pH, UA: 6 (ref 5.0–7.5)

## 2023-09-25 MED ORDER — GEMTESA 75 MG PO TABS: 75.0000 mg | ORAL_TABLET | Freq: Every day | ORAL | 11 refills | Status: DC

## 2023-09-25 MED ORDER — GEMTESA 75 MG PO TABS: 75.0000 mg | ORAL_TABLET | Freq: Every day | ORAL | Status: DC

## 2023-09-25 NOTE — Progress Notes (Signed)
 09/25/2023 3:06 PM   Delon FALCON Nunn 06/19/1955 [id number]  Referring provider: Sharma Coyer, MD [address] [place],  [place] [address]  No chief complaint on file.   HPI: I was consulted to assess the patient's urinary incontinence.  She is a retired Runner, broadcasting/film/video.  Sometimes she has urge incontinence.  No stress incontinence.  She has mild to moderate bedwetting but not every night.  Wears 1 or 2 pads a day moderately wet  Voids every 2 hours gets up once or twice at night  Partial responder to Detrol and no hysterectomy  No history of kidney stones bladder surgery or bladder infections.  No neurologic issues   PMH: Past Medical History:  Diagnosis Date   Allergy    Anxiety 06/2010   PTSD   Arthritis ?   Cannot recall   Depression    Sleep apnea ?   Cannot recall    Surgical History: Past Surgical History:  Procedure Laterality Date   arm and hand surgery Bilateral    2010 left; 2011 right    Home Medications:  Allergies as of 09/25/2023       Reactions   Hydrocodone-acetaminophen    Other reaction(s): VOMITING   Sulfa Antibiotics         Medication List        Accurate as of September 25, 2023  3:06 PM. If you have any questions, ask your nurse or doctor.          CLARITIN PO Take 10 mg by mouth daily.   DULoxetine  60 MG capsule Commonly known as: CYMBALTA  TAKE 1 CAPSULE BY MOUTH EVERY DAY   estradiol 2 MG tablet Commonly known as: ESTRACE Take 2 mg by mouth daily.   fluticasone  50 MCG/ACT nasal spray Commonly known as: FLONASE  Place 2 sprays into both nostrils daily.   MULTI VITAMIN DAILY PO 1 tablet daily.   multivitamin-lutein Caps capsule Take 1 capsule by mouth daily.   ondansetron  4 MG tablet Commonly known as: Zofran  Take 1 tablet (4 mg total) by mouth daily as needed for nausea or vomiting.   PROBIOTIC DAILY PO Take by mouth daily.   progesterone 200 MG capsule Commonly known as:  PROMETRIUM Take 200 mg by mouth daily.   Shingrix injection Generic drug: Zoster Vaccine Adjuvanted   tolterodine 4 MG 24 hr capsule Commonly known as: DETROL LA Take 4 mg by mouth daily.   Zepbound  5 MG/0.5ML Pen Generic drug: tirzepatide  Inject 5 mg into the skin once a week.   Zepbound  7.5 MG/0.5ML Pen Generic drug: tirzepatide  Inject 7.5 mg into the skin once a week. Start taking on: October 02, 2023        Allergies:  Allergies  Allergen Reactions   Hydrocodone-Acetaminophen     Other reaction(s): VOMITING   Sulfa Antibiotics     Family History: Family History  Problem Relation Age of Onset   Cataracts Mother    Arthritis Mother    Macular degeneration Mother    Vision loss Mother    Heart attack Maternal Grandmother    Hypertension Maternal Grandmother    Hypertension Maternal Grandfather    CVA Maternal Grandfather    Alzheimer's disease Maternal Grandfather    CVA Paternal Grandmother    Alzheimer's disease Paternal Grandmother    Hypertension Paternal Grandmother    Heart attack Paternal Grandfather    Heart disease Father    Healthy Daughter    Healthy Daughter     Social History:  reports that she has never smoked. She has never used smokeless tobacco. She reports that she does not drink alcohol and does not use drugs.  ROS:                                        Physical Exam: There were no vitals taken for this visit.  Constitutional:  Alert and oriented, No acute distress. HEENT: [place] AT, moist mucus membranes.  Trachea midline, no masses.  Laboratory Data: Lab Results  Component Value Date   WBC 7.0 10/05/2020   HGB 13.2 10/05/2020   HCT 39.6 10/05/2020   MCV 87 10/05/2020   PLT 359 10/05/2020    Lab Results  Component Value Date   CREATININE 0.79 10/05/2020    No results found for: PSA  No results found for: TESTOSTERONE  Lab Results  Component Value Date   HGBA1C 5.6 10/05/2020     Urinalysis    Component Value Date/Time   COLORURINE YELLOW 03/11/2008 0851   APPEARANCEUR CLEAR 03/11/2008 0851   LABSPEC 1.029 03/11/2008 0851   PHURINE 6.0 03/11/2008 0851   GLUCOSEU NEGATIVE 03/11/2008 0851   HGBUR NEGATIVE 03/11/2008 0851   BILIRUBINUR NEGATIVE 03/11/2008 0851   KETONESUR 15 (A) 03/11/2008 0851   PROTEINUR NEGATIVE 03/11/2008 0851   UROBILINOGEN 0.2 03/11/2008 0851   NITRITE NEGATIVE 03/11/2008 0851   LEUKOCYTESUR MODERATE (A) 03/11/2008 0851    Pertinent Imaging: Urine reviewed and sent for culture.  Chart reviewed  Assessment & Plan: Patient has mild mixed incontinence but primarily urge incontinence and bedwetting.  She understands that may order a test in the future.  Return in 6 weeks on Gemtesa  samples and prescription and proceed accordingly.  Stop the Detrol.  Call if culture positive  Patient want to hold off on the cystoscopy next visit which I felt was fine but we will do a pelvic examination next visit.  No blood in your  1. Mixed incontinence (Primary)    No follow-ups on file.  Glendia DELENA Elizabeth, MD  [facility] [address], [address] [place], [place] [address] [phone]

## 2023-09-27 ENCOUNTER — Ambulatory Visit: Payer: Self-pay

## 2023-09-27 MED ORDER — CEPHALEXIN 500 MG PO CAPS: 500.0000 mg | ORAL_CAPSULE | Freq: Three times a day (TID) | ORAL | 0 refills | Status: AC

## 2023-09-28 ENCOUNTER — Encounter: Payer: Self-pay | Admitting: Nurse Practitioner

## 2023-09-28 LAB — CULTURE, URINE COMPREHENSIVE

## 2023-09-29 NOTE — Telephone Encounter (Signed)
 Please reach out to patient regarding rescheduling her appointment.

## 2023-10-04 ENCOUNTER — Other Ambulatory Visit: Payer: Self-pay | Admitting: Family Medicine

## 2023-10-04 DIAGNOSIS — R110 Nausea: Secondary | ICD-10-CM

## 2023-10-05 DIAGNOSIS — M5416 Radiculopathy, lumbar region: Secondary | ICD-10-CM | POA: Diagnosis not present

## 2023-10-05 DIAGNOSIS — M9903 Segmental and somatic dysfunction of lumbar region: Secondary | ICD-10-CM | POA: Diagnosis not present

## 2023-10-05 DIAGNOSIS — M9902 Segmental and somatic dysfunction of thoracic region: Secondary | ICD-10-CM | POA: Diagnosis not present

## 2023-10-05 DIAGNOSIS — M9901 Segmental and somatic dysfunction of cervical region: Secondary | ICD-10-CM | POA: Diagnosis not present

## 2023-10-11 ENCOUNTER — Telehealth: Admitting: Physician Assistant

## 2023-10-11 DIAGNOSIS — H60332 Swimmer's ear, left ear: Secondary | ICD-10-CM

## 2023-10-11 MED ORDER — CIPROFLOXACIN-DEXAMETHASONE 0.3-0.1 % OT SUSP: OTIC | 0 refills | Status: DC

## 2023-10-11 NOTE — Progress Notes (Signed)
 E Visit for Ear Pain - Swimmer's Ear  We are sorry that you are not feeling well. Here is how we plan to help!  Based on what you have shared with me it looks like you have Swimmer's Ear.  Swimmer's ear is a redness or swelling, irritation, or infection of your outer ear canal. These symptoms usually occur within a few days of swimming. Your ear canal is a tube that goes from the opening of the ear to the eardrum.  When water stays in your ear canal, germs can grow.  This is a painful condition that often happens to children and swimmers of all ages.  It is not contagious and oral antibiotics are not required to treat uncomplicated swimmer's ear.  The usual symptoms include:    Itchiness inside the ear  Redness or a sense of swelling in the ear  Pain when the ear is tugged on when pressure is placed on the ear  Pus draining from the infected ear   I have prescribed: Ciprodex  otic suspension 4 drops in affected ears twice daily for 7 days  In certain cases, swimmer's ear may progress to a more serious bacterial infection of the middle or inner ear.  If you have a fever 102 and up and significantly worsening symptoms, this could indicate a more serious infection moving to the middle/inner and needs face to face evaluation in an office by a provider.  Your symptoms should improve over the next 3 days and should resolve in about 7 days.  Be sure to complete ALL of your prescription.  HOME CARE: Wash your hands frequently. If you are prescribed an ear drop, do not place the tip of the bottle on your ear or touch it with your fingers. You can take Acetaminophen  650 mg every 4-6 hours as needed for pain.  If pain is severe or moderate, you can apply a heating pad (set on low) or hot water bottle (wrapped in a towel) to outer ear for 20 minutes.  This will also increase drainage. Avoid ear plugs Do not go swimming until the symptoms are gone Do not use Q-tips After showers, help the water run out by  tilting your head to one side.   GET HELP RIGHT AWAY IF: Fever is over 102.2 degrees. You develop progressive ear pain or hearing loss. Ear symptoms persist longer than 3 days after treatment.  MAKE SURE YOU: Understand these instructions. Will watch your condition. Will get help right away if you are not doing well or get worse.  TO PREVENT SWIMMER'S EAR: Use a bathing cap or custom fitted swim molds to keep your ears dry. Towel off after swimming to dry your ears. Tilt your head or pull your earlobes to allow the water to escape your ear canal. If there is still water in your ears, consider using a hairdryer on the lowest setting.  Thank you for choosing an e-visit.  Your e-visit answers were reviewed by a board certified advanced clinical practitioner to complete your personal care plan. Depending upon the condition, your plan could have included both over the counter or prescription medications.  Please review your pharmacy choice. Make sure the pharmacy is open so you can pick up the prescription now. If there is a problem, you may contact your provider through [employer] and have the prescription routed to another pharmacy.  Your safety is important to us . If you have drug allergies check your prescription carefully.   For the next 24  hours you can use MyChart to ask questions about today's visit, request a non-urgent call back, or ask for a work or school excuse. You will get an email with a survey after your eVisit asking about your experience. We would appreciate your feedback. I hope that your e-visit has been valuable and will aid in your recovery.

## 2023-10-11 NOTE — Progress Notes (Signed)
 I have spent 5 minutes in review of e-visit questionnaire, review and updating patient chart, medical decision making and response to patient.   Elsie Velma Lunger, PA-C

## 2023-10-13 ENCOUNTER — Telehealth: Admitting: Family Medicine

## 2023-10-13 DIAGNOSIS — H6692 Otitis media, unspecified, left ear: Secondary | ICD-10-CM

## 2023-10-13 MED ORDER — AMOXICILLIN-POT CLAVULANATE 875-125 MG PO TABS: 1.0000 | ORAL_TABLET | Freq: Two times a day (BID) | ORAL | 0 refills | Status: DC

## 2023-10-13 NOTE — Progress Notes (Signed)

## 2023-10-16 DIAGNOSIS — M9903 Segmental and somatic dysfunction of lumbar region: Secondary | ICD-10-CM | POA: Diagnosis not present

## 2023-10-16 DIAGNOSIS — M5416 Radiculopathy, lumbar region: Secondary | ICD-10-CM | POA: Diagnosis not present

## 2023-10-16 DIAGNOSIS — M9902 Segmental and somatic dysfunction of thoracic region: Secondary | ICD-10-CM | POA: Diagnosis not present

## 2023-10-16 DIAGNOSIS — M9901 Segmental and somatic dysfunction of cervical region: Secondary | ICD-10-CM | POA: Diagnosis not present

## 2023-11-06 ENCOUNTER — Ambulatory Visit: Admitting: Family Medicine

## 2023-11-06 ENCOUNTER — Encounter: Payer: Self-pay | Admitting: Family Medicine

## 2023-11-06 VITALS — BP 123/70 | HR 77 | Resp 16 | Ht 65.0 in | Wt 205.0 lb

## 2023-11-06 DIAGNOSIS — F325 Major depressive disorder, single episode, in full remission: Secondary | ICD-10-CM | POA: Diagnosis not present

## 2023-11-06 DIAGNOSIS — G4733 Obstructive sleep apnea (adult) (pediatric): Secondary | ICD-10-CM

## 2023-11-06 DIAGNOSIS — I10 Essential (primary) hypertension: Secondary | ICD-10-CM | POA: Diagnosis not present

## 2023-11-06 DIAGNOSIS — Z6837 Body mass index (BMI) 37.0-37.9, adult: Secondary | ICD-10-CM

## 2023-11-06 DIAGNOSIS — E66812 Obesity, class 2: Secondary | ICD-10-CM

## 2023-11-06 DIAGNOSIS — R7303 Prediabetes: Secondary | ICD-10-CM

## 2023-11-06 MED ORDER — TIRZEPATIDE-WEIGHT MANAGEMENT 10 MG/0.5ML ~~LOC~~ SOAJ: 10.0000 mg | SUBCUTANEOUS | 2 refills | Status: DC

## 2023-11-06 NOTE — Assessment & Plan Note (Signed)
 Will repeat A1c in Nov-Dec for AWV  Continue Zepbound  and exercise to decrease insulin resistance

## 2023-11-06 NOTE — Assessment & Plan Note (Signed)
 Hypertension, chronic  Well-controlled hypertension with blood pressure at 123/70 mmHg, managed with lifestyle efforts. - Continue lifestyle efforts to manage hypertension.

## 2023-11-06 NOTE — Assessment & Plan Note (Signed)
Chronic  Stable with CPAP use  Patient advised to continue CPAP nightly  The patient has had significant benefit from the use of CPAP machine with considerable improvements in quality of life, work production and decrease medical complaints.  The patient will need continued maintenance and care CPAP supplies (including upgrades as deemed appropriate) in order to continue treatment for sleep apnea as this is medically necessary.     

## 2023-11-06 NOTE — Progress Notes (Signed)
 Established patient visit   Patient: Anne Waters   DOB: 09/11/1955   68 y.o. Female  MRN: [id number] Visit Date: 11/06/2023  Today's healthcare provider: Rockie Agent, MD   Chief Complaint  Patient presents with   Follow-up    F/u / wt loss > No other cocnerns ( Pt will be getting vaccines at the Pharm)   Subjective     HPI     Follow-up    Additional comments: F/u / wt loss > No other cocnerns ( Pt will be getting vaccines at the Pharm)      Last edited by Marylen Odella CROME, CMA on 11/06/2023 10:19 AM.       Discussed the use of AI scribe software for clinical note transcription with the patient, who gave verbal consent to proceed.  History of Present Illness Anne Waters is a 68 year old female who presents for follow-up on obesity management.  She has achieved a weight reduction from 224 pounds to 205 pounds, with her home scale showing 203 pounds. This weight loss is attributed to portion control and increased physical activity, including walking 30 minutes daily, which she started two weeks ago. She uses a phone app to track her water and protein intake and participates in a GLP-1 support group on Facebook. Her daily protein intake is about 90 to 100 grams, primarily from chicken and protein shakes, while she avoids red meat. She no longer desires sweets and is pleased with her progress, noting improved self-esteem and fitting into clothes she previously could not wear.  She continues to experience nausea as a side effect of her weight loss medication, Tirzepatide , which she takes at a dose of 7.5 mg weekly. She uses Zofran  4 mg as needed for nausea, typically taking it once or twice a week after her Sunday night injection. The nausea is manageable and does not require daily medication.  She is not on any antihypertensive medications. She reports nightly CPAP use for her obstructive sleep apnea.  She has a history of chronic depression,  currently in remission, and takes Gemtesa  75 mg daily for overactive bladder. She is on estrogen 2 mg daily for vasomotor symptoms related to menopause.  She plans to receive her influenza vaccine at the pharmacy and is scheduled to discuss the TDAP vaccine next Monday.     Past Medical History:  Diagnosis Date   Allergy    Anxiety 06/2010   PTSD   Arthritis ?   Cannot recall   Depression    Sleep apnea ?   Cannot recall    Medications: Outpatient Medications Prior to Visit  Medication Sig   amoxicillin -clavulanate (AUGMENTIN ) 875-125 MG tablet Take 1 tablet by mouth 2 (two) times daily.   ciprofloxacin -dexamethasone  (CIPRODEX ) OTIC suspension Apply 4 drops to the affected ear(s) twice daily x 7 days   DULoxetine  (CYMBALTA ) 60 MG capsule TAKE 1 CAPSULE BY MOUTH EVERY DAY   estradiol (ESTRACE) 2 MG tablet Take 2 mg by mouth daily.   fluticasone  (FLONASE ) 50 MCG/ACT nasal spray Place 2 sprays into both nostrils daily.   Loratadine (CLARITIN PO) Take 10 mg by mouth daily.   Multiple Vitamin (MULTI VITAMIN DAILY PO) 1 tablet daily.    multivitamin-lutein (OCUVITE-LUTEIN) CAPS capsule Take 1 capsule by mouth daily.   ondansetron  (ZOFRAN ) 4 MG tablet TAKE 1 TABLET BY MOUTH DAILY AS NEEDED FOR NAUSEA OR VOMITING.   Probiotic Product (PROBIOTIC DAILY PO) Take by mouth daily.  progesterone (PROMETRIUM) 200 MG capsule Take 200 mg by mouth daily.   SHINGRIX injection    tolterodine (DETROL LA) 4 MG 24 hr capsule Take 4 mg by mouth daily.   Vibegron  (GEMTESA ) 75 MG TABS Take 1 tablet (75 mg total) by mouth daily.   [DISCONTINUED] tirzepatide  (ZEPBOUND ) 7.5 MG/0.5ML Pen Inject 7.5 mg into the skin once a week.   Vibegron  (GEMTESA ) 75 MG TABS Take 1 tablet (75 mg total) by mouth daily.   Facility-Administered Medications Prior to Visit  Medication Dose Route Frequency Provider   betamethasone  acetate-betamethasone  sodium phosphate (CELESTONE ) injection 3 mg  3 mg Intra-articular Once  Janit Thresa HERO, DPM    Review of Systems  Last CBC Lab Results  Component Value Date   WBC 7.0 10/05/2020   HGB 13.2 10/05/2020   HCT 39.6 10/05/2020   MCV 87 10/05/2020   MCH 28.9 10/05/2020   RDW 12.6 10/05/2020   PLT 359 10/05/2020   Last metabolic panel Lab Results  Component Value Date   GLUCOSE 77 10/05/2020   NA 143 10/05/2020   K 4.6 10/05/2020   CL 105 10/05/2020   CO2 24 10/05/2020   BUN 14 10/05/2020   CREATININE 0.79 10/05/2020   EGFR 84 10/05/2020   CALCIUM 9.8 10/05/2020   PROT 7.1 10/05/2020   ALBUMIN 4.5 10/05/2020   LABGLOB 2.6 10/05/2020   AGRATIO 1.7 10/05/2020   BILITOT 0.2 10/05/2020   ALKPHOS 86 10/05/2020   AST 19 10/05/2020   ALT 16 10/05/2020   Last lipids Lab Results  Component Value Date   CHOL 190 10/05/2020   HDL 51 10/05/2020   LDLCALC 118 (H) 10/05/2020   TRIG 116 10/05/2020   CHOLHDL 3.7 10/05/2020   Last hemoglobin A1c Lab Results  Component Value Date   HGBA1C 5.6 10/05/2020   Last thyroid  functions Lab Results  Component Value Date   TSH 2.790 10/05/2020   Last vitamin D No results found for: 25OHVITD2, 25OHVITD3, VD25OH Last vitamin B12 and Folate Lab Results  Component Value Date   VITAMINB12 487 03/10/2008   FOLATE  03/10/2008    19.3 (NOTE)  Reference Ranges        Deficient:       0.4 - 3.3 ng/mL        Indeterminate:   3.4 - 5.4 ng/mL        Normal:              > 5.4 ng/mL        Objective    BP 123/70 (BP Location: Right Arm, Patient Position: Sitting, Cuff Size: Normal)   Pulse 77   Resp 16   Ht 5' 5 (1.651 m)   Wt 205 lb (93 kg)   SpO2 100%   BMI 34.11 kg/m   BP Readings from Last 3 Encounters:  11/06/23 123/70  09/25/23 131/79  08/07/23 137/80   Wt Readings from Last 3 Encounters:  11/06/23 205 lb (93 kg)  09/25/23 212 lb 12.8 oz (96.5 kg)  08/07/23 224 lb (101.6 kg)        Physical Exam Vitals reviewed.  Constitutional:      General: She is not in acute distress.     Appearance: Normal appearance. She is not ill-appearing.  Cardiovascular:     Rate and Rhythm: Normal rate and regular rhythm.  Pulmonary:     Effort: Pulmonary effort is normal. No respiratory distress.     Breath sounds: No wheezing, rhonchi or rales.  Neurological:     Mental Status: She is alert and oriented to person, place, and time.  Psychiatric:        Mood and Affect: Mood normal.        Behavior: Behavior normal.       No results found for any visits on 11/06/23.  Assessment & Plan     Problem List Items Addressed This Visit     Class 2 severe obesity with serious comorbidity and body mass index (BMI) of 37.0 to 37.9 in adult   Obesity, class 2 Chronic condition  Obesity, class 2 with significant weight loss from 224 pounds to 205 pounds. BMI has decreased from 38 to 34. She is on Tirzepatide  7.5 mg weekly, which is well-tolerated with some nausea managed by Zofran . Engaging in lifestyle modifications including portion control, increased physical activity, and protein tracking. - Increase Tirzepatide  to 10 mg weekly and monitor tolerance - Continue Zofran  4 mg as needed for nausea - Encourage continued lifestyle modifications including portion control and physical activity - Schedule annual wellness visit and update labs      Relevant Medications   tirzepatide  (ZEPBOUND ) 10 MG/0.5ML Pen   Depression, major, single episode, complete remission   Chronic Anxiety and depression managed with duloxetine  60 mg daily.  - Continue duloxetine  60 mg daily.      Essential (primary) hypertension - Primary   Hypertension, chronic  Well-controlled hypertension with blood pressure at 123/70 mmHg, managed with lifestyle efforts. - Continue lifestyle efforts to manage hypertension.      OSA (obstructive sleep apnea)   Chronic  Stable with CPAP use  Patient advised to continue CPAP nightly  The patient has had significant benefit from the use of CPAP machine with considerable  improvements in quality of life, work production and decrease medical complaints.  The patient will need continued maintenance and care CPAP supplies (including upgrades as deemed appropriate) in order to continue treatment for sleep apnea as this is medically necessary.         Prediabetes   Will repeat A1c in Nov-Dec for AWV  Continue Zepbound  and exercise to decrease insulin resistance         Assessment & Plan   Nausea secondary to weight loss medication Nausea secondary to Tirzepatide  use, occurring primarily at the beginning of the week after administration. Managed with Zofran  as needed. - Continue Zofran  4 mg as needed for nausea    Overactive bladder Overactive bladder managed with Gemtesa  75 mg daily. - Continue Gemtesa  75 mg daily    General Health Maintenance Due for influenza vaccination and TDAP. Plans to receive influenza vaccine at pharmacy and TDAP next week. - Receive influenza vaccine at pharmacy - Receive TDAP vaccination next week at pharmacy      Return in about 3 months (around 02/05/2024) for AWV & labs .        Rockie Agent, MD  [facility] [phone] (phone) [phone] (fax)  [facility]

## 2023-11-06 NOTE — Assessment & Plan Note (Signed)
 Obesity, class 2 Chronic condition  Obesity, class 2 with significant weight loss from 224 pounds to 205 pounds. BMI has decreased from 38 to 34. She is on Tirzepatide  7.5 mg weekly, which is well-tolerated with some nausea managed by Zofran . Engaging in lifestyle modifications including portion control, increased physical activity, and protein tracking. - Increase Tirzepatide  to 10 mg weekly and monitor tolerance - Continue Zofran  4 mg as needed for nausea - Encourage continued lifestyle modifications including portion control and physical activity - Schedule annual wellness visit and update labs

## 2023-11-06 NOTE — Assessment & Plan Note (Signed)
 Chronic Anxiety and depression managed with duloxetine  60 mg daily.  - Continue duloxetine  60 mg daily.

## 2023-11-06 NOTE — Patient Instructions (Signed)
 To keep you healthy, please keep in mind the following health maintenance items that you are due for:   Health Maintenance Due  Topic Date Due   DTaP/Tdap/Td (1 - Tdap) Never done   Pneumococcal Vaccine: 50+ Years (1 of 1 - PCV) Never done   Zoster Vaccines- Shingrix (2 of 2) 01/03/2023   Influenza Vaccine  09/08/2023   Medicare Annual Wellness (AWV)  09/28/2023   COVID-19 Vaccine (4 - 2025-26 season) 10/09/2023     Best Wishes,   Dr. Lang

## 2023-11-14 DIAGNOSIS — H25813 Combined forms of age-related cataract, bilateral: Secondary | ICD-10-CM | POA: Diagnosis not present

## 2023-11-15 ENCOUNTER — Encounter: Payer: Self-pay | Admitting: Family Medicine

## 2023-12-11 ENCOUNTER — Ambulatory Visit: Admitting: Urology

## 2023-12-20 ENCOUNTER — Other Ambulatory Visit: Payer: Self-pay | Admitting: Family Medicine

## 2023-12-20 NOTE — Progress Notes (Signed)
 Pt's husband mentioned that patient is having trouble with CPAP pressures and counseled pt spouse to have Anne Waters reach out so we can refer for sleep study titration

## 2023-12-28 ENCOUNTER — Encounter: Payer: Self-pay | Admitting: Family Medicine

## 2023-12-29 NOTE — Telephone Encounter (Signed)
 Pt having difficulties with CPAP. Has not been seen in a year. Please schedule virtual visit or in person visit with any APP to trouble shoot. Thanks!

## 2023-12-30 ENCOUNTER — Other Ambulatory Visit: Payer: Self-pay | Admitting: Family Medicine

## 2023-12-30 DIAGNOSIS — F325 Major depressive disorder, single episode, in full remission: Secondary | ICD-10-CM

## 2024-01-03 DIAGNOSIS — M9905 Segmental and somatic dysfunction of pelvic region: Secondary | ICD-10-CM | POA: Diagnosis not present

## 2024-01-03 DIAGNOSIS — M9901 Segmental and somatic dysfunction of cervical region: Secondary | ICD-10-CM | POA: Diagnosis not present

## 2024-01-03 DIAGNOSIS — M542 Cervicalgia: Secondary | ICD-10-CM | POA: Diagnosis not present

## 2024-01-03 DIAGNOSIS — M9903 Segmental and somatic dysfunction of lumbar region: Secondary | ICD-10-CM | POA: Diagnosis not present

## 2024-01-03 DIAGNOSIS — M9904 Segmental and somatic dysfunction of sacral region: Secondary | ICD-10-CM | POA: Diagnosis not present

## 2024-01-03 DIAGNOSIS — M9902 Segmental and somatic dysfunction of thoracic region: Secondary | ICD-10-CM | POA: Diagnosis not present

## 2024-01-19 ENCOUNTER — Observation Stay (HOSPITAL_BASED_OUTPATIENT_CLINIC_OR_DEPARTMENT_OTHER)
Admission: EM | Admit: 2024-01-19 | Discharge: 2024-01-22 | Disposition: A | Attending: Emergency Medicine | Admitting: Emergency Medicine

## 2024-01-19 ENCOUNTER — Emergency Department (HOSPITAL_BASED_OUTPATIENT_CLINIC_OR_DEPARTMENT_OTHER): Admitting: Radiology

## 2024-01-19 ENCOUNTER — Emergency Department (HOSPITAL_BASED_OUTPATIENT_CLINIC_OR_DEPARTMENT_OTHER)

## 2024-01-19 ENCOUNTER — Other Ambulatory Visit: Payer: Self-pay

## 2024-01-19 DIAGNOSIS — Z79899 Other long term (current) drug therapy: Secondary | ICD-10-CM | POA: Diagnosis not present

## 2024-01-19 DIAGNOSIS — R1011 Right upper quadrant pain: Principal | ICD-10-CM | POA: Insufficient documentation

## 2024-01-19 DIAGNOSIS — G4733 Obstructive sleep apnea (adult) (pediatric): Secondary | ICD-10-CM | POA: Diagnosis present

## 2024-01-19 DIAGNOSIS — R7401 Elevation of levels of liver transaminase levels: Secondary | ICD-10-CM | POA: Insufficient documentation

## 2024-01-19 DIAGNOSIS — F419 Anxiety disorder, unspecified: Secondary | ICD-10-CM | POA: Diagnosis present

## 2024-01-19 DIAGNOSIS — E66812 Obesity, class 2: Secondary | ICD-10-CM | POA: Insufficient documentation

## 2024-01-19 DIAGNOSIS — R109 Unspecified abdominal pain: Secondary | ICD-10-CM | POA: Diagnosis present

## 2024-01-19 DIAGNOSIS — Z89112 Acquired absence of left hand: Secondary | ICD-10-CM | POA: Diagnosis not present

## 2024-01-19 DIAGNOSIS — Z6837 Body mass index (BMI) 37.0-37.9, adult: Secondary | ICD-10-CM | POA: Insufficient documentation

## 2024-01-19 DIAGNOSIS — F329 Major depressive disorder, single episode, unspecified: Secondary | ICD-10-CM | POA: Insufficient documentation

## 2024-01-19 DIAGNOSIS — I10 Essential (primary) hypertension: Secondary | ICD-10-CM | POA: Diagnosis not present

## 2024-01-19 DIAGNOSIS — R079 Chest pain, unspecified: Secondary | ICD-10-CM | POA: Diagnosis present

## 2024-01-19 DIAGNOSIS — F325 Major depressive disorder, single episode, in full remission: Secondary | ICD-10-CM | POA: Diagnosis present

## 2024-01-19 DIAGNOSIS — R7303 Prediabetes: Secondary | ICD-10-CM | POA: Diagnosis not present

## 2024-01-19 LAB — CBC
HCT: 41.4 % (ref 36.0–46.0)
Hemoglobin: 14.2 g/dL (ref 12.0–15.0)
MCH: 29.4 pg (ref 26.0–34.0)
MCHC: 34.3 g/dL (ref 30.0–36.0)
MCV: 85.7 fL (ref 80.0–100.0)
Platelets: 388 K/uL (ref 150–400)
RBC: 4.83 MIL/uL (ref 3.87–5.11)
RDW: 12.5 % (ref 11.5–15.5)
WBC: 13.6 K/uL — ABNORMAL HIGH (ref 4.0–10.5)
nRBC: 0 % (ref 0.0–0.2)

## 2024-01-19 LAB — BASIC METABOLIC PANEL WITH GFR
Anion gap: 15 (ref 5–15)
BUN: 16 mg/dL (ref 8–23)
CO2: 22 mmol/L (ref 22–32)
Calcium: 10.1 mg/dL (ref 8.9–10.3)
Chloride: 100 mmol/L (ref 98–111)
Creatinine, Ser: 0.75 mg/dL (ref 0.44–1.00)
GFR, Estimated: >60 mL/min (ref >60)
Glucose, Bld: 138 mg/dL — ABNORMAL HIGH (ref 70–99)
Potassium: 3.6 mmol/L (ref 3.5–5.1)
Sodium: 137 mmol/L (ref 135–145)

## 2024-01-19 LAB — HEPATIC FUNCTION PANEL
ALT: 118 U/L — ABNORMAL HIGH (ref 0–44)
AST: 202 U/L — ABNORMAL HIGH (ref 15–41)
Albumin: 4.3 g/dL (ref 3.5–5.0)
Alkaline Phosphatase: 182 U/L — ABNORMAL HIGH (ref 38–126)
Bilirubin, Direct: 0.5 mg/dL — ABNORMAL HIGH (ref 0.0–0.2)
Indirect Bilirubin: 0.5 mg/dL (ref 0.3–0.9)
Total Bilirubin: 1 mg/dL (ref 0.0–1.2)
Total Protein: 7.8 g/dL (ref 6.5–8.1)

## 2024-01-19 LAB — TROPONIN T, HIGH SENSITIVITY: Troponin T High Sensitivity: <15 ng/L (ref 0–19)

## 2024-01-19 LAB — LIPASE, BLOOD: Lipase: 20 U/L (ref 11–51)

## 2024-01-19 MED ORDER — SODIUM CHLORIDE 0.9 % IV SOLN
2.0000 g | Freq: Once | INTRAVENOUS | Status: AC
  Administered 2024-01-19: 2 g via INTRAVENOUS
  Filled 2024-01-19: qty 20

## 2024-01-19 MED ORDER — ONDANSETRON HCL 4 MG/2ML IJ SOLN
4.0000 mg | Freq: Once | INTRAMUSCULAR | Status: AC
  Administered 2024-01-19: 4 mg via INTRAVENOUS
  Filled 2024-01-19: qty 2

## 2024-01-19 MED ORDER — FENTANYL CITRATE (PF) 50 MCG/ML IJ SOSY
25.0000 ug | PREFILLED_SYRINGE | Freq: Once | INTRAMUSCULAR | Status: AC
  Administered 2024-01-19: 25 ug via INTRAVENOUS
  Filled 2024-01-19: qty 1

## 2024-01-19 MED ORDER — IOHEXOL 300 MG/ML  SOLN
100.0000 mL | Freq: Once | INTRAMUSCULAR | Status: AC | PRN
  Administered 2024-01-19: 100 mL via INTRAVENOUS

## 2024-01-19 NOTE — ED Provider Notes (Signed)
 [place] EMERGENCY DEPARTMENT AT [facility] Provider Note   CSN: [id number] Arrival date & time: 01/19/24  1408     Patient presents with: Chest Pain   Anne Waters is a 68 y.o. female.  Past medical history significant for hypertension presents today for epigastric pain that began around 830 this morning.  Patient does report it feels like it goes through to her back.  Patient also reports a two episodes of emesis and shortness of breath associated with the pain.  Patient reports she is on a GLP-1 but has not recently changed her dosage.    Chest Pain Associated symptoms: abdominal pain, nausea and vomiting        Prior to Admission medications  Medication Sig Start Date End Date Taking? Authorizing Provider  amoxicillin -clavulanate (AUGMENTIN ) 875-125 MG tablet Take 1 tablet by mouth 2 (two) times daily. 10/13/23   Blair, Diane W, FNP  ciprofloxacin -dexamethasone  (CIPRODEX ) OTIC suspension Apply 4 drops to the affected ear(s) twice daily x 7 days 10/11/23   Gladis Elsie BROCKS, PA-C  DULoxetine  (CYMBALTA ) 60 MG capsule TAKE 1 CAPSULE BY MOUTH EVERY DAY 01/01/24   Simmons-Robinson, Makiera, MD  estradiol  (ESTRACE ) 2 MG tablet Take 2 mg by mouth daily. 04/21/23   [provider]  fluticasone  (FLONASE ) 50 MCG/ACT nasal spray Place 2 sprays into both nostrils daily. 02/12/20   Gladis, Mary-Margaret, FNP  Loratadine (CLARITIN PO) Take 10 mg by mouth daily.    [provider]  Multiple Vitamin (MULTI VITAMIN DAILY PO) 1 tablet daily.  04/21/08   [provider]  multivitamin-lutein (OCUVITE-LUTEIN) CAPS capsule Take 1 capsule by mouth daily.    [provider]  ondansetron  (ZOFRAN ) 4 MG tablet TAKE 1 TABLET BY MOUTH DAILY AS NEEDED FOR NAUSEA OR VOMITING. 10/04/23   Simmons-Robinson, Rockie, MD  Probiotic Product (PROBIOTIC DAILY PO) Take by mouth daily.    [provider]  progesterone (PROMETRIUM) 200 MG capsule Take 200 mg by mouth  daily. 04/21/23   [provider]  [facility] injection  03/22/23   [provider]  tirzepatide  (ZEPBOUND ) 10 MG/0.5ML Pen Inject 10 mg into the skin once a week. 11/06/23   Simmons-Robinson, Makiera, MD  tolterodine (DETROL LA) 4 MG 24 hr capsule Take 4 mg by mouth daily. 06/14/19   [provider]  Vibegron  (GEMTESA ) 75 MG TABS Take 1 tablet (75 mg total) by mouth daily. 09/25/23   Gaston Hamilton, MD    Allergies: Hydrocodone-acetaminophen and Sulfa antibiotics    Review of Systems  Gastrointestinal:  Positive for abdominal pain, nausea and vomiting.    Updated Vital Signs BP (!) 161/86   Pulse 80   Temp 98.3 F (36.8 C) (Oral)   Resp 16   SpO2 100%   Physical Exam Vitals and nursing note reviewed.  Constitutional:      General: She is not in acute distress.    Appearance: She is well-developed. She is not toxic-appearing.  HENT:     Head: Normocephalic and atraumatic.  Eyes:     Conjunctiva/sclera: Conjunctivae normal.  Cardiovascular:     Rate and Rhythm: Normal rate and regular rhythm.     Heart sounds: No murmur heard. Pulmonary:     Effort: Pulmonary effort is normal. No respiratory distress.     Breath sounds: Normal breath sounds.  Abdominal:     General: There is no distension.     Palpations: Abdomen is soft.     Tenderness: There is abdominal tenderness in  the right upper quadrant and epigastric area. Positive signs include Murphy's sign.  Musculoskeletal:        General: No swelling.     Cervical back: Neck supple.     Right lower leg: No edema.     Left lower leg: No edema.  Skin:    General: Skin is warm and dry.     Capillary Refill: Capillary refill takes less than 2 seconds.  Neurological:     General: No focal deficit present.     Mental Status: She is alert and oriented to person, place, and time.  Psychiatric:        Mood and Affect: Mood normal.     (all labs ordered are listed, but only abnormal results are  displayed) Labs Reviewed  BASIC METABOLIC PANEL WITH GFR - Abnormal; Notable for the following components:      Result Value   Glucose, Bld 138 (*)    All other components within normal limits  CBC - Abnormal; Notable for the following components:   WBC 13.6 (*)    All other components within normal limits  HEPATIC FUNCTION PANEL - Abnormal; Notable for the following components:   AST 202 (*)    ALT 118 (*)    Alkaline Phosphatase 182 (*)    Bilirubin, Direct 0.5 (*)    All other components within normal limits  LIPASE, BLOOD  TROPONIN T, HIGH SENSITIVITY  TROPONIN T, HIGH SENSITIVITY    EKG: EKG Interpretation Date/Time:  Friday January 19 2024 14:14:30 EST Ventricular Rate:  82 PR Interval:  176 QRS Duration:  90 QT Interval:  374 QTC Calculation: 436 R Axis:   4  Text Interpretation: Normal sinus rhythm Normal ECG When compared with ECG of 30-Apr-2010 17:12, T wave amplitude has decreased in Anterior leads QT has shortened No significant change since last tracing Confirmed by Dean Clarity [phone]) on 01/19/2024 2:30:43 PM  Radiology: CT ABDOMEN PELVIS W CONTRAST Result Date: 01/19/2024 CLINICAL DATA:  Right upper quadrant pain EXAM: CT ABDOMEN AND PELVIS WITH CONTRAST TECHNIQUE: Multidetector CT imaging of the abdomen and pelvis was performed using the standard protocol following bolus administration of intravenous contrast. RADIATION DOSE REDUCTION: This exam was performed according to the departmental dose-optimization program which includes automated exposure control, adjustment of the mA and/or kV according to patient size and/or use of iterative reconstruction technique. CONTRAST:  OMNIPAQUE  IOHEXOL  300 MG/ML  SOLN COMPARISON:  Ultrasound 01/19/2024 FINDINGS: Lower chest: Lung bases demonstrate no acute airspace disease. Hepatobiliary: No focal hepatic abnormality. No biliary distention. There appears to be mild diffuse gallbladder wall thickening versus trace  pericholecystic fluid and suspicion of minimal fat stranding. No calcified stone. Pancreas: Unremarkable. No pancreatic ductal dilatation or surrounding inflammatory changes. Spleen: Normal in size without focal abnormality. Adrenals/Urinary Tract: Adrenal glands are unremarkable. Kidneys are normal, without renal calculi, focal lesion, or hydronephrosis. Bladder slightly thick walled but nearly empty. Stomach/Bowel: Stomach is within normal limits. Appendix not well seen but no right lower quadrant inflammation. No evidence of bowel wall thickening, distention, or inflammatory changes. Vascular/Lymphatic: No significant vascular findings are present. No enlarged abdominal or pelvic lymph nodes. Reproductive: Uterus and bilateral adnexa are unremarkable. Other: Negative for ascites or free air Musculoskeletal: No acute or suspicious osseous abnormality IMPRESSION: 1. Mild diffuse gallbladder wall thickening versus trace pericholecystic fluid and suspicion of minimal fat stranding. No calcified stone. Preceding ultrasound does not confirm stones or sonographic features suggesting acute cholecystitis. Suggest correlation with nuclear medicine hepatobiliary  imaging. 2. Slightly thick-walled appearance of the urinary bladder which is nearly empty. Correlate with urinalysis to exclude cystitis. Electronically Signed   By: Luke Bun M.D.   On: 01/19/2024 19:21   US  Abdomen Limited RUQ (LIVER/GB) Result Date: 01/19/2024 CLINICAL DATA:  Right upper quadrant pain beginning this morning. EXAM: ULTRASOUND ABDOMEN LIMITED RIGHT UPPER QUADRANT COMPARISON:  None Available. FINDINGS: Gallbladder: No gallstones or wall thickening visualized. No sonographic Murphy sign noted by sonographer. Common bile duct: Diameter: 4 mm, within normal limits. Liver: No focal lesion identified. Within normal limits in parenchymal echogenicity. Portal vein is patent on color Doppler imaging with normal direction of blood flow towards the  liver. Other: None. IMPRESSION: Negative. No hepatobiliary abnormality identified. Electronically Signed   By: Norleen DELENA Kil M.D.   On: 01/19/2024 17:56   DG Chest 2 View Result Date: 01/19/2024 CLINICAL DATA:  Chest pain EXAM: CHEST - 2 VIEW COMPARISON:  06/15/2012 FINDINGS: The heart size and mediastinal contours are within normal limits. Both lungs are clear. Partially visualized hardware in the right humerus. Surgical clips in the left axilla. IMPRESSION: No active cardiopulmonary disease. Electronically Signed   By: Luke Bun M.D.   On: 01/19/2024 15:43     Procedures   Medications Ordered in the ED  cefTRIAXone  (ROCEPHIN ) 2 g in sodium chloride  0.9 % 100 mL IVPB (has no administration in time range)  fentaNYL  (SUBLIMAZE ) injection 25 mcg (25 mcg Intravenous Given 01/19/24 1831)  ondansetron  (ZOFRAN ) injection 4 mg (4 mg Intravenous Given 01/19/24 1832)  iohexol  (OMNIPAQUE ) 300 MG/ML solution 100 mL (100 mLs Intravenous Contrast Given 01/19/24 1824)  fentaNYL  (SUBLIMAZE ) injection 25 mcg (25 mcg Intravenous Given 01/19/24 2206)  ondansetron  (ZOFRAN ) injection 4 mg (4 mg Intravenous Given 01/19/24 2207)                                    Medical Decision Making Amount and/or Complexity of Data Reviewed Labs: ordered. Radiology: ordered.  Risk Prescription drug management.   This patient presents to the ED for concern of epigastric abdominal pain, this involves an extensive number of treatment options, and is a complaint that carries with it a high risk of complications and morbidity.  The differential diagnosis includes STEMI, NSTEMI, arrhythmia, anemia, electro abnormality, pancreatitis, appendicitis, choledocholithiasis, acute cholecystitis, diverticulitis, SBO, volvulus, viral GI illness   Co morbidities / Chronic conditions that complicate the patient evaluation  Hypertension   Additional history obtained:  Additional history obtained from EMR External records  from outside source obtained and reviewed including family medicine notes   Lab Tests:  I Ordered, and personally interpreted labs.  The pertinent results include: Leukocytosis at 13.6, BMP unremarkable, troponin less than 15, elevated alk phos at 182, elevated AST at 202, elevated ALT at 118, lipase 20   Imaging Studies ordered:  I ordered imaging studies including CXR  I independently visualized and interpreted imaging which showed no active cardiopulmonary disease I agree with the radiologist interpretation RUQ US : Negative.  No hepatobiliary abnormality identified. CT abdomen pelvis with contrast: Mild diffuse gallbladder wall thickening versus trace  pericholecystic fluid and suspicion of minimal fat stranding.  Suggest correlation with nuclear medicine hepatobiliary imaging.   Cardiac Monitoring: / EKG:  The patient was maintained on a cardiac monitor.  I personally viewed and interpreted the cardiac monitored which showed an underlying rhythm of: NSR   Problem List / ED Course / Critical  interventions / Medication management I ordered medication including fentanyl  and Zofran , Rocephin  I have reviewed the patients home medicines and have made adjustments as needed   Test / Admission - Considered:  Consult GI, Dr. Dianna who recommended consulting general surgery, but likely admission for pain control would be necessary. Consulted general surgery, Dr. Rubin who felt the patient should be admitted for a HIDA scan and general surgery would consult if surgical intervention was indicated. Consulted hospitalist, Dr. Alfornia who is agreeable to admission     Final diagnoses:  RUQ pain    ED Discharge Orders     None          Francis Ileana LOISE DEVONNA 01/19/24 2216    Dean Clarity, MD 01/22/24 [phone]

## 2024-01-19 NOTE — ED Triage Notes (Signed)
 Patient states chest pain that began this morning around 8:30. Radiates to upper abdomen. Vomiting this morning. Also endorses shortness of breath.

## 2024-01-19 NOTE — Plan of Care (Signed)
 Plan of Care Note for accepted transfer   Patient name: Anne Waters FMW:[id number] DOB: 12-12-1955  Facility requesting transfer: [facility] Requesting Provider: Francis Ileana LOISE DEVONNA Facility course: 68 year old female with history of anxiety, depression, obesity, OSA, hypertension, prediabetes presenting with right upper quadrant abdominal pain, vomiting, and shortness of breath.  EKG without STEMI.  Chest x-ray showing no active cardiopulmonary disease.  Right upper quadrant abdominal ultrasound negative, however, CT showing mild diffuse gallbladder wall thickening versus trace pericholecystic fluid and suspicion for minimal fat stranding.  Slightly hypertensive but vital signs otherwise stable.  WBC count 13.6, initial troponin negative and repeat pending, AST 202, ALT 118, alk phos 182, T. bili 1.0, lipase normal.  Patient was given fentanyl and Zofran .  ED PA spoke to general surgeon Dr. Rubin who recommended admission to hospitalist service for HIDA scan to rule out acute cholecystitis and to formally consult general surgery based on scan results.  Plan of care: The patient is accepted for admission to Telemetry unit at [facility]  [facility] will assume care on arrival to accepting facility. Until arrival, care as per EDP. However, [facility] available 24/7 for questions and assistance.  Check [email or web address] for on-call coverage.  Nursing staff, please call [facility] Admits & Consults System-Wide number under Amion on patient's arrival so appropriate admitting provider can evaluate the pt.

## 2024-01-19 NOTE — ED Notes (Signed)
 EXTRA BLUE TOP SENT TO LAB TO HOLD

## 2024-01-20 ENCOUNTER — Encounter (HOSPITAL_BASED_OUTPATIENT_CLINIC_OR_DEPARTMENT_OTHER): Payer: Self-pay | Admitting: Internal Medicine

## 2024-01-20 DIAGNOSIS — R109 Unspecified abdominal pain: Secondary | ICD-10-CM | POA: Diagnosis present

## 2024-01-20 DIAGNOSIS — R1084 Generalized abdominal pain: Secondary | ICD-10-CM | POA: Diagnosis not present

## 2024-01-20 DIAGNOSIS — R1011 Right upper quadrant pain: Secondary | ICD-10-CM | POA: Diagnosis present

## 2024-01-20 DIAGNOSIS — Z743 Need for continuous supervision: Secondary | ICD-10-CM | POA: Diagnosis not present

## 2024-01-20 LAB — CREATININE, SERUM
Creatinine, Ser: 0.93 mg/dL (ref 0.44–1.00)
GFR, Estimated: >60 mL/min (ref >60)

## 2024-01-20 LAB — PHOSPHORUS: Phosphorus: 4.2 mg/dL (ref 2.5–4.6)

## 2024-01-20 LAB — MAGNESIUM: Magnesium: 2.3 mg/dL (ref 1.7–2.4)

## 2024-01-20 LAB — HIV ANTIBODY (ROUTINE TESTING W REFLEX): HIV Screen 4th Generation wRfx: NONREACTIVE

## 2024-01-20 MED ORDER — RISAQUAD PO CAPS
1.0000 | ORAL_CAPSULE | Freq: Every day | ORAL | Status: DC
  Administered 2024-01-21 – 2024-01-22 (×2): 1 via ORAL
  Filled 2024-01-20 (×2): qty 1

## 2024-01-20 MED ORDER — HEPARIN SODIUM (PORCINE) 5000 UNIT/ML IJ SOLN
5000.0000 [IU] | Freq: Three times a day (TID) | INTRAMUSCULAR | Status: DC
  Administered 2024-01-20 – 2024-01-22 (×6): 5000 [IU] via SUBCUTANEOUS
  Filled 2024-01-20 (×6): qty 1

## 2024-01-20 MED ORDER — ESTRADIOL 0.5 MG PO TABS
2.0000 mg | ORAL_TABLET | Freq: Every day | ORAL | Status: DC
  Filled 2024-01-20: qty 4

## 2024-01-20 MED ORDER — DULOXETINE HCL 60 MG PO CPEP
60.0000 mg | ORAL_CAPSULE | Freq: Every day | ORAL | Status: DC
  Administered 2024-01-20: 60 mg via ORAL
  Filled 2024-01-20 (×2): qty 1

## 2024-01-20 MED ORDER — ONDANSETRON HCL 4 MG/2ML IJ SOLN: 4.0000 mg | Freq: Four times a day (QID) | INTRAMUSCULAR | Status: DC | PRN

## 2024-01-20 MED ORDER — SODIUM CHLORIDE 0.9 % IV SOLN: INTRAVENOUS | Status: AC

## 2024-01-20 MED ORDER — SODIUM CHLORIDE 0.9% FLUSH
3.0000 mL | Freq: Two times a day (BID) | INTRAVENOUS | Status: DC
  Administered 2024-01-20 – 2024-01-22 (×3): 3 mL via INTRAVENOUS

## 2024-01-20 MED ORDER — MORPHINE SULFATE (PF) 4 MG/ML IV SOLN
4.0000 mg | Freq: Once | INTRAVENOUS | Status: AC
  Administered 2024-01-20: 4 mg via INTRAVENOUS
  Filled 2024-01-20: qty 1

## 2024-01-20 MED ORDER — ONDANSETRON HCL 4 MG/2ML IJ SOLN
4.0000 mg | Freq: Once | INTRAMUSCULAR | Status: AC
  Administered 2024-01-20: 4 mg via INTRAVENOUS
  Filled 2024-01-20: qty 2

## 2024-01-20 MED ORDER — SENNOSIDES-DOCUSATE SODIUM 8.6-50 MG PO TABS: 1.0000 | ORAL_TABLET | Freq: Every evening | ORAL | Status: DC | PRN

## 2024-01-20 MED ORDER — TRAZODONE HCL 50 MG PO TABS: 25.0000 mg | ORAL_TABLET | Freq: Every evening | ORAL | Status: DC | PRN

## 2024-01-20 MED ORDER — HYDRALAZINE HCL 20 MG/ML IJ SOLN: 10.0000 mg | INTRAMUSCULAR | Status: DC | PRN

## 2024-01-20 MED ORDER — SODIUM CHLORIDE 0.9% FLUSH
3.0000 mL | Freq: Two times a day (BID) | INTRAVENOUS | Status: DC
  Administered 2024-01-20 (×2): 3 mL via INTRAVENOUS

## 2024-01-20 MED ORDER — ONDANSETRON HCL 4 MG PO TABS: 4.0000 mg | ORAL_TABLET | Freq: Four times a day (QID) | ORAL | Status: DC | PRN

## 2024-01-20 MED ORDER — IPRATROPIUM BROMIDE 0.02 % IN SOLN: 0.5000 mg | Freq: Four times a day (QID) | RESPIRATORY_TRACT | Status: DC | PRN

## 2024-01-20 MED ORDER — FLEET ENEMA RE ENEM: 1.0000 | ENEMA | Freq: Once | RECTAL | Status: DC | PRN

## 2024-01-20 MED ORDER — SODIUM CHLORIDE 0.9 % IV SOLN
2.0000 g | INTRAVENOUS | Status: DC
  Administered 2024-01-20 – 2024-01-21 (×2): 2 g via INTRAVENOUS
  Filled 2024-01-20 (×2): qty 20

## 2024-01-20 MED ORDER — IBUPROFEN 200 MG PO TABS
600.0000 mg | ORAL_TABLET | Freq: Four times a day (QID) | ORAL | Status: DC | PRN
  Administered 2024-01-20 – 2024-01-21 (×3): 600 mg via ORAL
  Filled 2024-01-20 (×3): qty 3

## 2024-01-20 MED ORDER — BISACODYL 5 MG PO TBEC: 5.0000 mg | DELAYED_RELEASE_TABLET | Freq: Every day | ORAL | Status: DC | PRN

## 2024-01-20 MED ORDER — OXYCODONE HCL 5 MG PO TABS: 5.0000 mg | ORAL_TABLET | ORAL | Status: DC | PRN

## 2024-01-20 NOTE — Assessment & Plan Note (Signed)
-   As needed Xanax, continue home medication of Cymbalta

## 2024-01-20 NOTE — Plan of Care (Signed)

## 2024-01-20 NOTE — Plan of Care (Signed)

## 2024-01-20 NOTE — ED Notes (Signed)
 Anne Waters w/ cl called for transport

## 2024-01-20 NOTE — Assessment & Plan Note (Signed)
 Pending HIDA scan -Continue to have abdominal pain -Continue as needed analgesics  - Ultrasound-right upper quadrant-negative - CT abdomen/pelvis: Reporting diffuse gallbladder wall thickening, versus trace of pericholecystic fluid suspicious for minimal fat stranding WBC 13.6, AST 202, ALT 118, alk phos 182, T. bili 1.0, -Lipase-normal  EDP apparently discussed the case with GI then general surgery Dr. Rubin who recommended patient to be admitted to [facility]-to obtain HIDA scan to rule out acute cholecystitis-and consult surgery formally based on results.

## 2024-01-20 NOTE — Assessment & Plan Note (Signed)
 Patient last A1c 5.8 a year ago, repeating A1c -Monitoring CBG

## 2024-01-20 NOTE — H&P (Signed)
 History and Physical   Patient: Anne Waters                            PCP: Sharma Coyer, MD                    DOB: September 21, 1955            DOA: 01/19/2024 FMW:[id number]             DOS: 01/20/2024, 3:19 PM  Simmons-Robinson, Coyer, MD  Patient coming from:   HOME  I have personally reviewed patient's medical records, in electronic medical records, including:  [facility], and care everywhere.    Chief Complaint:   Abdominal pain  History of present illness:    Anne Waters is a 68 year old female with extensive history of HTN, prediabetes, anxiety, depression, obesity, OSA... Presented overnight at [facility] with right upper quadrant pain.  Pain started abruptly approximately 24 hours ago, persistent, improved with pain medication, was associated with 2 bouts of vomiting nonbloody. Denied having any fever or chills, or diarrhea.  ED report-Evaluation: Current vitals: Vitals:   01/20/24 1340 01/20/24 1436  BP: 117/75 139/87  Pulse: 70 77  Resp: 14 18  Temp:  97.6 F (36.4 C)  SpO2: 95% 99%    EKG-normal sinus rhythm -Chest x-ray no active cardiopulmonary disease - Ultrasound-right upper quadrant-negative - CT abdomen/pelvis: Reporting diffuse gallbladder wall thickening, versus trace of pericholecystic fluid suspicious for minimal fat stranding WBC 13.6, AST 202, ALT 118, alk phos 182, T. bili 1.0, -Lipase-normal  EDP apparently discussed the case with general surgery Dr. Rubin who recommended [facility] be [facility]-to obtain HIDA scan to rule out acute cholecystitis-and consult surgery formally based on results.   Patient Denies having: Fever, Chills, Cough, SOB, Chest Pain,, headache, dizziness, lightheadedness,  Dysuria, Joint pain, rash, open wounds   Review of Systems: As per HPI, otherwise 10 point review of systems were negative.    ----------------------------------------------------------------------------------------------------------------------  Allergies[1]  Home MEDs:  Prior to Admission medications  Medication Sig Start Date End Date Taking? Authorizing Provider  amoxicillin -clavulanate (AUGMENTIN ) 875-125 MG tablet Take 1 tablet by mouth 2 (two) times daily. 10/13/23   Blair, Diane W, FNP  ciprofloxacin -dexamethasone  (CIPRODEX ) OTIC suspension Apply 4 drops to the affected ear(s) twice daily x 7 days 10/11/23   Gladis Elsie BROCKS, PA-C  DULoxetine  (CYMBALTA ) 60 MG capsule TAKE 1 CAPSULE BY MOUTH EVERY DAY 01/01/24   Simmons-Robinson, Makiera, MD  estradiol  (ESTRACE ) 2 MG tablet Take 2 mg by mouth daily. 04/21/23   [provider]  fluticasone  (FLONASE ) 50 MCG/ACT nasal spray Place 2 sprays into both nostrils daily. 02/12/20   Gladis, Mary-Margaret, FNP  Loratadine (CLARITIN PO) Take 10 mg by mouth daily.    [provider]  Multiple Vitamin (MULTI VITAMIN DAILY PO) 1 tablet daily.  04/21/08   [provider]  multivitamin-lutein (OCUVITE-LUTEIN) CAPS capsule Take 1 capsule by mouth daily.    [provider]  ondansetron  (ZOFRAN ) 4 MG tablet TAKE 1 TABLET BY MOUTH DAILY AS NEEDED FOR NAUSEA OR VOMITING. 10/04/23   Simmons-Robinson, Makiera, MD  Probiotic Product (PROBIOTIC DAILY PO) Take by mouth daily.    [provider]  progesterone (PROMETRIUM) 200 MG capsule Take 200 mg by mouth daily. 04/21/23   [provider]  [facility] injection  03/22/23   [provider]  tirzepatide  (ZEPBOUND ) 10 MG/0.5ML Pen Inject 10 mg  into the skin once a week. 11/06/23   Simmons-Robinson, Makiera, MD  tolterodine (DETROL LA) 4 MG 24 hr capsule Take 4 mg by mouth daily. 06/14/19   [provider]  Vibegron  (GEMTESA ) 75 MG TABS Take 1 tablet (75 mg total) by mouth daily. 09/25/23   Gaston Hamilton, MD    PRN MEDs: bisacodyl , hydrALAZINE , ibuprofen , ipratropium,  ondansetron  **OR** ondansetron  (ZOFRAN ) IV, oxyCODONE , senna-docusate, sodium phosphate, traZODone   Past Medical History:  Diagnosis Date   Allergy    Anxiety 06/2010   PTSD   Arthritis ?   Cannot recall   Depression    Sleep apnea ?   Cannot recall    Past Surgical History:  Procedure Laterality Date   arm and hand surgery Bilateral    2010 left; 2011 right     reports that she has never smoked. She has never used smokeless tobacco. She reports that she does not drink alcohol and does not use drugs.   Family History  Problem Relation Age of Onset   Cataracts Mother    Arthritis Mother    Macular degeneration Mother    Vision loss Mother    Heart attack Maternal Grandmother    Hypertension Maternal Grandmother    Hypertension Maternal Grandfather    CVA Maternal Grandfather    Alzheimer's disease Maternal Grandfather    CVA Paternal Grandmother    Alzheimer's disease Paternal Grandmother    Hypertension Paternal Grandmother    Heart attack Paternal Grandfather    Heart disease Father    Healthy Daughter    Healthy Daughter     Physical Exam:   Vitals:   01/20/24 1300 01/20/24 1340 01/20/24 1436 01/20/24 1515  BP: 106/66 117/75 139/87 139/87  Pulse: 73 70 77 77  Resp: 14 14 18 18   Temp:   97.6 F (36.4 C) 97.6 F (36.4 C)  TempSrc:    Oral  SpO2: 96% 95% 99%   Weight:    81.3 kg  Height:    5' 6 (1.676 m)   Constitutional: NAD, calm, comfortable Eyes: PERRL, lids and conjunctivae normal ENMT: Mucous membranes are moist. Posterior pharynx clear of any exudate or lesions.Normal dentition.  Neck: normal, supple, no masses, no thyromegaly Respiratory: clear to auscultation bilaterally, no wheezing, no crackles. Normal respiratory effort. No accessory muscle use.  Cardiovascular: Regular rate and rhythm, no murmurs / rubs / gallops. No extremity edema. 2+ pedal pulses. No carotid bruits.  Abdomen: Right upper quadrant tenderness with deep palpation, mild  rebound tenderness no tenderness, no masses palpated. No hepatosplenomegaly. Bowel sounds positive.  Musculoskeletal: Amputated left hand  no clubbing / cyanosis. No joint deformity upper and lower extremities. Good ROM, no contractures. Normal muscle tone.  Neurologic: CN II-XII grossly intact. Sensation intact, DTR normal. Strength 5/5 in all 4.  Psychiatric: Normal judgment and insight. Alert and oriented x 3. Normal mood.  Skin: no rashes, lesions, ulcers. No induration     Labs on admission:    I have personally reviewed following labs and imaging studies  CBC: Recent Labs  Lab 01/19/24 1419  WBC 13.6*  HGB 14.2  HCT 41.4  MCV 85.7  PLT 388   Basic Metabolic Panel: Recent Labs  Lab 01/19/24 1419  NA 137  K 3.6  CL 100  CO2 22  GLUCOSE 138*  BUN 16  CREATININE 0.75  CALCIUM 10.1   GFR: Estimated Creatinine Clearance: 74.4 mL/min (by C-G formula based on SCr of 0.75 mg/dL). Liver Function Tests: Recent  Labs  Lab 01/19/24 1436  AST 202*  ALT 118*  ALKPHOS 182*  BILITOT 1.0  PROT 7.8  ALBUMIN 4.3   Recent Labs  Lab 01/19/24 1518  LIPASE 20    Urine analysis:    Component Value Date/Time   COLORURINE YELLOW 03/11/2008 0851   APPEARANCEUR Clear 09/25/2023 1509   LABSPEC 1.029 03/11/2008 0851   PHURINE 6.0 03/11/2008 0851   GLUCOSEU Negative 09/25/2023 1509   HGBUR NEGATIVE 03/11/2008 0851   BILIRUBINUR Negative 09/25/2023 1509   KETONESUR 15 (A) 03/11/2008 0851   PROTEINUR Negative 09/25/2023 1509   PROTEINUR NEGATIVE 03/11/2008 0851   UROBILINOGEN 0.2 03/11/2008 0851   NITRITE Negative 09/25/2023 1509   NITRITE NEGATIVE 03/11/2008 0851   LEUKOCYTESUR Negative 09/25/2023 1509    Last A1C:  Lab Results  Component Value Date   HGBA1C 5.6 10/05/2020     Radiologic Exams on Admission:   CT ABDOMEN PELVIS W CONTRAST Result Date: 01/19/2024 CLINICAL DATA:  Right upper quadrant pain EXAM: CT ABDOMEN AND PELVIS WITH CONTRAST TECHNIQUE:  Multidetector CT imaging of the abdomen and pelvis was performed using the standard protocol following bolus administration of intravenous contrast. RADIATION DOSE REDUCTION: This exam was performed according to the departmental dose-optimization program which includes automated exposure control, adjustment of the mA and/or kV according to patient size and/or use of iterative reconstruction technique. CONTRAST:  OMNIPAQUE  IOHEXOL  300 MG/ML  SOLN COMPARISON:  Ultrasound 01/19/2024 FINDINGS: Lower chest: Lung bases demonstrate no acute airspace disease. Hepatobiliary: No focal hepatic abnormality. No biliary distention. There appears to be mild diffuse gallbladder wall thickening versus trace pericholecystic fluid and suspicion of minimal fat stranding. No calcified stone. Pancreas: Unremarkable. No pancreatic ductal dilatation or surrounding inflammatory changes. Spleen: Normal in size without focal abnormality. Adrenals/Urinary Tract: Adrenal glands are unremarkable. Kidneys are normal, without renal calculi, focal lesion, or hydronephrosis. Bladder slightly thick walled but nearly empty. Stomach/Bowel: Stomach is within normal limits. Appendix not well seen but no right lower quadrant inflammation. No evidence of bowel wall thickening, distention, or inflammatory changes. Vascular/Lymphatic: No significant vascular findings are present. No enlarged abdominal or pelvic lymph nodes. Reproductive: Uterus and bilateral adnexa are unremarkable. Other: Negative for ascites or free air Musculoskeletal: No acute or suspicious osseous abnormality IMPRESSION: 1. Mild diffuse gallbladder wall thickening versus trace pericholecystic fluid and suspicion of minimal fat stranding. No calcified stone. Preceding ultrasound does not confirm stones or sonographic features suggesting acute cholecystitis. Suggest correlation with nuclear medicine hepatobiliary imaging. 2. Slightly thick-walled appearance of the urinary bladder  which is nearly empty. Correlate with urinalysis to exclude cystitis. Electronically Signed   By: Luke Bun M.D.   On: 01/19/2024 19:21   US  Abdomen Limited RUQ (LIVER/GB) Result Date: 01/19/2024 CLINICAL DATA:  Right upper quadrant pain beginning this morning. EXAM: ULTRASOUND ABDOMEN LIMITED RIGHT UPPER QUADRANT COMPARISON:  None Available. FINDINGS: Gallbladder: No gallstones or wall thickening visualized. No sonographic Murphy sign noted by sonographer. Common bile duct: Diameter: 4 mm, within normal limits. Liver: No focal lesion identified. Within normal limits in parenchymal echogenicity. Portal vein is patent on color Doppler imaging with normal direction of blood flow towards the liver. Other: None. IMPRESSION: Negative. No hepatobiliary abnormality identified. Electronically Signed   By: Norleen DELENA Kil M.D.   On: 01/19/2024 17:56   DG Chest 2 View Result Date: 01/19/2024 CLINICAL DATA:  Chest pain EXAM: CHEST - 2 VIEW COMPARISON:  06/15/2012 FINDINGS: The heart size and mediastinal contours are within normal limits. Both  lungs are clear. Partially visualized hardware in the right humerus. Surgical clips in the left axilla. IMPRESSION: No active cardiopulmonary disease. Electronically Signed   By: Luke Bun M.D.   On: 01/19/2024 15:43    EKG:   Independently reviewed.  Orders placed or performed during the hospital encounter of 01/19/24   ED EKG   ED EKG   EKG 12-Lead   EKG 12-Lead   EKG   EKG   EKG   EKG   EKG 12-Lead   ---------------------------------------------------------------------------------------------------------------------------------------    Assessment / Plan:   Principal Problem:   Abdominal pain Active Problems:   Essential (primary) hypertension   Depression, major, single episode, complete remission   OSA (obstructive sleep apnea)   Anxiety   Prediabetes   History of amputation of left hand   Class 2 severe obesity with serious comorbidity  and body mass index (BMI) of 37.0 to 37.9 in adult   Assessment and Plan: * Abdominal pain Pending HIDA scan -Continue to have abdominal pain -Continue as needed analgesics  - Ultrasound-right upper quadrant-negative - CT abdomen/pelvis: Reporting diffuse gallbladder wall thickening, versus trace of pericholecystic fluid suspicious for minimal fat stranding WBC 13.6, AST 202, ALT 118, alk phos 182, T. bili 1.0, -Lipase-normal  EDP apparently discussed the case with GI then general surgery Dr. Rubin who recommended patient to be admitted to [facility] obtain HIDA scan to rule out acute cholecystitis-and consult surgery formally based on results.  Class 2 severe obesity with serious comorbidity and body mass index (BMI) of 37.0 to 37.9 in adult Discussed with patient regarding increasing exercise, healthy diet Follow-up with PCP with aggressive weight loss programs and medications   History of amputation of left hand Stable  Prediabetes Patient last A1c 5.8 a year ago, repeating A1c -Monitoring CBG   Anxiety - As needed Xanax, continue home medication of Cymbalta   OSA (obstructive sleep apnea) Continue nightly CPAP  Depression, major, single episode, complete remission Continue Cymbalta   Essential (primary) hypertension Currently not on any medication, monitoring BP closely -As needed IV hydralazine  for SBP > 160      Consults called:  If warranted will consult general surgery after HIDA scan results GI and general surgery Dr. Brunetta has been curb sided by EDP at [facility] -------------------------------------------------------------------------------------------------------------------------------------------- DVT prophylaxis:  heparin  injection 5,000 Units Start: 01/20/24 1545 SCDs Start: 01/20/24 1446   Code Status:   Code Status: Full Code   Admission status: Patient will be admitted as Observation, with a greater than 2 midnight length of stay. Level of  care: Telemetry   Family Communication:  none at bedside  (The above findings and plan of care has been discussed with patient in detail, the patient expressed understanding and agreement of above plan)  --------------------------------------------------------------------------------------------------------------------------------------------------  Disposition Plan:  Anticipated 1-2 days Status is: Observation The patient remains OBS appropriate and will d/c before 2 midnights.     ----------------------------------------------------------------------------------------------------------------------------------------------------  Time spent:  74  Min.  Was spent seeing and evaluating the patient, reviewing all medical records, drawn plan of care.  SIGNED: Adriana DELENA Grams, MD, FHM. FAAFP. [facility], Pager  (Please use [email or web address] to page/ or secure chat through epic) If 7PM-7AM, please contact night-coverage [email or web address],  01/20/2024, 3:19 PM     [1]  Allergies Allergen Reactions   Hydrocodone-Acetaminophen     Other reaction(s): VOMITING   Sulfa Antibiotics

## 2024-01-20 NOTE — Assessment & Plan Note (Signed)
 Discussed with patient regarding increasing exercise, healthy diet Follow-up with PCP with aggressive weight loss programs and medications

## 2024-01-20 NOTE — Assessment & Plan Note (Signed)
 Stable

## 2024-01-20 NOTE — Assessment & Plan Note (Signed)
 -  Continue nightly CPAP

## 2024-01-20 NOTE — Hospital Course (Addendum)
 ITZAYANA PARDY is a 68 year old female with extensive history of HTN, prediabetes, anxiety, depression, obesity, OSA... Presented overnight at [facility] with right upper quadrant pain.  Pain started abruptly approximately 24 hours ago, persistent, improved with pain medication, was associated with 2 bouts of vomiting nonbloody. Denied having any fever or chills, or diarrhea.  ED report-Evaluation: Current vitals: Vitals:   01/20/24 1340 01/20/24 1436  BP: 117/75 139/87  Pulse: 70 77  Resp: 14 18  Temp:  97.6 F (36.4 C)  SpO2: 95% 99%    EKG-normal sinus rhythm -Chest x-ray no active cardiopulmonary disease - Ultrasound-right upper quadrant-negative - CT abdomen/pelvis: Reporting diffuse gallbladder wall thickening, versus trace of pericholecystic fluid suspicious for minimal fat stranding WBC 13.6, AST 202, ALT 118, alk phos 182, T. bili 1.0, -Lipase-normal  EDP apparently discussed the case with general surgery Dr. Rubin who recommended patient to be admitted to [facility]-to obtain HIDA scan to rule out acute cholecystitis-and consult surgery formally based on results.

## 2024-01-20 NOTE — Assessment & Plan Note (Signed)
 Continue Cymbalta.

## 2024-01-20 NOTE — Assessment & Plan Note (Signed)
 Currently not on any medication, monitoring BP closely -As needed IV hydralazine  for SBP > 160

## 2024-01-21 ENCOUNTER — Encounter (HOSPITAL_COMMUNITY): Payer: Self-pay

## 2024-01-21 ENCOUNTER — Observation Stay (HOSPITAL_COMMUNITY)

## 2024-01-21 LAB — COMPREHENSIVE METABOLIC PANEL WITH GFR
ALT: 77 U/L — ABNORMAL HIGH (ref 0–44)
AST: 35 U/L (ref 15–41)
Albumin: 2.9 g/dL — ABNORMAL LOW (ref 3.5–5.0)
Alkaline Phosphatase: 104 U/L (ref 38–126)
Anion gap: 11 (ref 5–15)
BUN: 12 mg/dL (ref 8–23)
CO2: 21 mmol/L — ABNORMAL LOW (ref 22–32)
Calcium: 8.4 mg/dL — ABNORMAL LOW (ref 8.9–10.3)
Chloride: 106 mmol/L (ref 98–111)
Creatinine, Ser: 0.71 mg/dL (ref 0.44–1.00)
GFR, Estimated: >60 mL/min (ref >60)
Glucose, Bld: 79 mg/dL (ref 70–99)
Potassium: 3.6 mmol/L (ref 3.5–5.1)
Sodium: 138 mmol/L (ref 135–145)
Total Bilirubin: 0.3 mg/dL (ref 0.0–1.2)
Total Protein: 5.8 g/dL — ABNORMAL LOW (ref 6.5–8.1)

## 2024-01-21 LAB — GLUCOSE, CAPILLARY: Glucose-Capillary: 85 mg/dL (ref 70–99)

## 2024-01-21 MED ORDER — DULOXETINE HCL 60 MG PO CPEP
60.0000 mg | ORAL_CAPSULE | Freq: Every day | ORAL | Status: DC
  Administered 2024-01-21: 60 mg via ORAL
  Filled 2024-01-21: qty 1

## 2024-01-21 MED ORDER — ESTRADIOL 0.5 MG PO TABS
2.0000 mg | ORAL_TABLET | Freq: Every day | ORAL | Status: DC
  Administered 2024-01-21: 2 mg via ORAL
  Filled 2024-01-21 (×2): qty 4

## 2024-01-21 MED ORDER — TECHNETIUM TC 99M MEBROFENIN IV KIT
5.0000 | PACK | Freq: Once | INTRAVENOUS | Status: AC
  Administered 2024-01-21: 4.9 via INTRAVENOUS

## 2024-01-21 NOTE — Evaluation (Signed)
 Physical Therapy Evaluation & Discharge Patient Details Name: Anne Waters MRN: [id number] DOB: [birth year] Today's Date: 01/21/2024  History of Present Illness  68 y.o. female admitted 01/19/24 with abdominal pain. Workup revealed gallbladder wall thickening; workup for potential cholecystitis. PMH includes L hand amputation, OSA on CPAP, HTN, anxiety, depression, obesity (taking zepbound ).   Clinical Impression  Patient evaluated by Physical Therapy with no further acute PT needs identified. PTA, pt independent, active, lives with husband, helps care for elderly mother. Today, pt indep with mobility and self-care tasks. All education has been completed and the patient has no further questions. Acute PT is signing off. Thank you for this referral.    SpO2 96% on RA, HR 82 with ambulation     If plan is discharge home, recommend the following:  N/A   Can travel by private vehicle    Yes    Equipment Recommendations None recommended by PT  Recommendations for Other Services       Functional Status Assessment Patient has not had a recent decline in their functional status     Precautions / Restrictions Precautions Precautions: None Recall of Precautions/Restrictions: Intact Restrictions Weight Bearing Restrictions Per Provider Order: No      Mobility  Bed Mobility Overal bed mobility: Independent                  Transfers Overall transfer level: Independent Equipment used: None                    Ambulation/Gait Ambulation/Gait assistance: Independent Gait Distance (Feet): 800 Feet Assistive device: None, IV Pole Gait Pattern/deviations: WFL(Within Functional Limits)          Stairs            Wheelchair Mobility     Tilt Bed    Modified Rankin (Stroke Patients Only)       Balance Overall balance assessment: Independent                                           Pertinent Vitals/Pain Pain  Assessment Pain Assessment: 0-10 Pain Score: 2  Pain Location: abdomen Pain Descriptors / Indicators: Discomfort Pain Intervention(s): Monitored during session    Home Living Family/patient expects to be discharged to:: Private residence Living Arrangements: Spouse/significant other Available Help at Discharge: Family;Available 24 hours/day Type of Home: House                  Prior Function Prior Level of Function : Independent/Modified Independent             Mobility Comments: independent without DME; her and brother help care for elderly mother who still lives by herself. sings on worship team at church ADLs Comments: indep ADL/iADLs     Extremity/Trunk Assessment   Upper Extremity Assessment Upper Extremity Assessment: Overall WFL for tasks assessed (h/o L distal forearm amputation)    Lower Extremity Assessment Lower Extremity Assessment: Overall WFL for tasks assessed       Communication   Communication Communication: No apparent difficulties    Cognition Arousal: Alert Behavior During Therapy: WFL for tasks assessed/performed   PT - Cognitive impairments: No apparent impairments                         Following commands: Intact       Cueing  General Comments      Exercises     Assessment/Plan    PT Assessment Patient does not need any further PT services  PT Problem List         PT Treatment Interventions      PT Goals (Current goals can be found in the Care Plan section)  Acute Rehab PT Goals PT Goal Formulation: All assessment and education complete, DC therapy    Frequency       Co-evaluation               AM-PAC PT 6 Clicks Mobility  Outcome Measure Help needed turning from your back to your side while in a flat bed without using bedrails?: None Help needed moving from lying on your back to sitting on the side of a flat bed without using bedrails?: None Help needed moving to and from a bed to a  chair (including a wheelchair)?: None Help needed standing up from a chair using your arms (e.g., wheelchair or bedside chair)?: None Help needed to walk in hospital room?: None Help needed climbing 3-5 steps with a railing? : None 6 Click Score: 24    End of Session   Activity Tolerance: Patient tolerated treatment well Patient left: in bed;with call bell/phone within reach Nurse Communication: Mobility status PT Visit Diagnosis: Other abnormalities of gait and mobility (R26.89)    Time: 9073-9054 PT Time Calculation (min) (ACUTE ONLY): 19 min   Charges:   PT Evaluation $PT Eval Low Complexity: 1 Low   PT General Charges $$ ACUTE PT VISIT: 1 Visit       Darice Almas, PT, DPT Acute Rehabilitation Services  Personal: Secure Chat Rehab Office: [phone]  Darice LITTIE Almas 01/21/2024, 12:16 PM

## 2024-01-21 NOTE — Plan of Care (Signed)

## 2024-01-21 NOTE — Progress Notes (Signed)
°   01/21/24 1451  TOC Brief Assessment  Insurance and Status Reviewed  Patient has primary care physician Yes  Home environment has been reviewed home w/ spouse  Prior level of function: independent  Prior/Current Home Services No current home services  Social Drivers of Health Review SDOH reviewed no interventions necessary  Readmission risk has been reviewed Yes  Transition of care needs no transition of care needs at this time    Consult received for PCP and HH needs. Spoke with pt at bedside- confirmed PCP correct that is showing in Epic. Pt from home w/ Spouse and independent at baseline. Per therapy evals pt is back to baseline and no follow up need identified.

## 2024-01-21 NOTE — Progress Notes (Signed)
 OT Cancellation Note - OT Screen  Patient Details Name: Anne Waters MRN: [id number] DOB: 1956/01/29   Cancelled Treatment:    Reason Eval/Treat Not Completed: OT screened, no needs identified, will sign off (OT screen completed. Pt currently at baseline PLOF with pt demonstrating ability to complete ADLs and functional mobility Independent/Mod I. No acute skilled OT or equipment needs identified at this time. OT is signing off.)  Margarie Rockey HERO., OTR/L, MA Acute Rehab [phone]   Margarie FORBES Horns 01/21/2024, 12:24 PM

## 2024-01-21 NOTE — Care Management Obs Status (Signed)
 MEDICARE OBSERVATION STATUS NOTIFICATION   Patient Details  Name: Anne Waters MRN: [id number] Date of Birth: May 30, 1955   Medicare Observation Status Notification Given:  Yes    Charlann Rayfield Hurst, RN 01/21/2024, 2:47 PM

## 2024-01-21 NOTE — Progress Notes (Signed)
°  Progress Note   Patient: Anne Waters FMW:[id number] DOB: Jun 14, 1955 DOA: 01/19/2024     0 DOS: the patient was seen and examined on 01/21/2024   Brief hospital course: 68 year old female with extensive history of HTN, prediabetes, anxiety, depression, obesity, OSA... Presented overnight at [facility] with right upper quadrant pain.  Pain started abruptly approximately 24 hours ago, persistent, improved with pain medication, was associated with 2 bouts of vomiting nonbloody. Denied having any fever or chills, or diarrhea  - CT abdomen/pelvis: Reporting diffuse gallbladder wall thickening, versus trace of pericholecystic fluid suspicious for minimal fat stranding WBC 13.6, AST 202, ALT 118, alk phos 182, T. bili 1.0, -Lipase-normal   EDP apparently discussed the case with general surgery Dr. Rubin who recommended patient to be admitted [facility]-to obtain HIDA scan to rule out acute cholecystitis-and consult surgery formally based on results.  Assessment and Plan: Abdominal pain with elevated LFT's at time of presentation -HIDA pending -Now clinically much improved this AM -LFT's trending down -f/u on HIDA. Possible formal Gen surg consult pending results    Class 2 severe obesity with serious comorbidity and body mass index (BMI) of 37.0 to 37.9 in adult -recommend diet/lifestyle modification    History of amputation of left hand Stable   Prediabetes Patient last A1c 5.8 a year ago, repeating A1c   Anxiety - As needed Xanax, continue home medication of Cymbalta    OSA (obstructive sleep apnea) Continue nightly CPAP   Depression, major, single episode, complete remission Continue Cymbalta    Essential (primary) hypertension Currently not on any medication, monitoring BP closely -BP currently stable   Subjective: Feeling better this AM. Asking for something to eat this AM  Physical Exam: Vitals:   01/21/24 0300 01/21/24 0800 01/21/24 1134 01/21/24 1522  BP: 120/65  132/64 (!) 141/78 (!) 144/81  Pulse: 66 71 71 73  Resp: 16     Temp: 97.7 F (36.5 C)   97.9 F (36.6 C)  TempSrc: Oral   Oral  SpO2: 96% 98%  97%  Weight:      Height:       General exam: Awake, laying in bed, in nad Respiratory system: Normal respiratory effort, no wheezing Cardiovascular system: regular rate, s1, s2 Gastrointestinal system: Soft, nondistended, positive BS Central nervous system: CN2-12 grossly intact, strength intact Extremities: Perfused, no clubbing Skin: Normal skin turgor, no notable skin lesions seen Psychiatry: Mood normal // affect normal  Data Reviewed:  Labs reviewed: Na 138, K 3.6, Cr 0.71, Alk phos 104, AST 35, ALT 77  Family Communication: Pt in room, family at bedside  Disposition: Status is: Observation The patient remains OBS appropriate and will d/c before 2 midnights.  Planned Discharge Destination: Home    Author: Garnette Pelt, MD 01/21/2024 4:28 PM  For on call review [email or web address].

## 2024-01-22 ENCOUNTER — Other Ambulatory Visit (HOSPITAL_COMMUNITY): Payer: Self-pay

## 2024-01-22 LAB — COMPREHENSIVE METABOLIC PANEL WITH GFR
ALT: 54 U/L — ABNORMAL HIGH (ref 0–44)
AST: 23 U/L (ref 15–41)
Albumin: 3 g/dL — ABNORMAL LOW (ref 3.5–5.0)
Alkaline Phosphatase: 94 U/L (ref 38–126)
Anion gap: 6 (ref 5–15)
BUN: 9 mg/dL (ref 8–23)
CO2: 27 mmol/L (ref 22–32)
Calcium: 8.3 mg/dL — ABNORMAL LOW (ref 8.9–10.3)
Chloride: 108 mmol/L (ref 98–111)
Creatinine, Ser: 0.64 mg/dL (ref 0.44–1.00)
GFR, Estimated: >60 mL/min (ref >60)
Glucose, Bld: 86 mg/dL (ref 70–99)
Potassium: 3.7 mmol/L (ref 3.5–5.1)
Sodium: 141 mmol/L (ref 135–145)
Total Bilirubin: 0.2 mg/dL (ref 0.0–1.2)
Total Protein: 6 g/dL — ABNORMAL LOW (ref 6.5–8.1)

## 2024-01-22 LAB — CBC
HCT: 35.8 % — ABNORMAL LOW (ref 36.0–46.0)
Hemoglobin: 12.1 g/dL (ref 12.0–15.0)
MCH: 29.4 pg (ref 26.0–34.0)
MCHC: 33.8 g/dL (ref 30.0–36.0)
MCV: 86.9 fL (ref 80.0–100.0)
Platelets: 357 K/uL (ref 150–400)
RBC: 4.12 MIL/uL (ref 3.87–5.11)
RDW: 12.8 % (ref 11.5–15.5)
WBC: 7.9 K/uL (ref 4.0–10.5)
nRBC: 0 % (ref 0.0–0.2)

## 2024-01-22 MED ORDER — KETOROLAC TROMETHAMINE 10 MG PO TABS
10.0000 mg | ORAL_TABLET | Freq: Four times a day (QID) | ORAL | 0 refills | Status: AC | PRN
  Filled 2024-01-22: qty 20, 5d supply, fill #0

## 2024-01-22 NOTE — Plan of Care (Signed)

## 2024-01-22 NOTE — Discharge Summary (Signed)
 Physician Discharge Summary   Patient: Anne Waters MRN: [id number] DOB: [birth year]  Admit date:     01/19/2024  Discharge date: 01/22/2024  Discharge Physician: Garnette Pelt   PCP: Sharma Coyer, MD   Recommendations at discharge:    Follow up with PCP in 1-2 weeks Recommend establishing with local General Surgeon  Discharge Diagnoses: Principal Problem:   Abdominal pain Active Problems:   Essential (primary) hypertension   Depression, major, single episode, complete remission   OSA (obstructive sleep apnea)   Anxiety   Prediabetes   History of amputation of left hand   Class 2 severe obesity with serious comorbidity and body mass index (BMI) of 37.0 to 37.9 in adult  Resolved Problems:   * No resolved hospital problems. *  Hospital Course: 68 year old female with extensive history of HTN, prediabetes, anxiety, depression, obesity, OSA... Presented overnight at [facility] with right upper quadrant pain.  Pain started abruptly approximately 24 hours ago, persistent, improved with pain medication, was associated with 2 bouts of vomiting nonbloody. Denied having any fever or chills, or diarrhea  - CT abdomen/pelvis: Reporting diffuse gallbladder wall thickening, versus trace of pericholecystic fluid suspicious for minimal fat stranding WBC 13.6, AST 202, ALT 118, alk phos 182, T. bili 1.0, -Lipase-normal   EDP apparently discussed the case with general surgery Dr. Rubin who recommended patient to be admitted [facility]-to obtain HIDA scan to rule out acute cholecystitis-and consult surgery formally based on results.  Assessment and Plan: Abdominal pain with elevated LFT's at time of presentation -HIDA reviewed with pt. CBD and cystic duct patent. No acute cholecystitis -Abd CT reviewed. Mild diffuse GB wall thickening vs trace pericholecystic fluid. -RUQ US  unremarkable -Now clinically much improved and tolerating diet -LFT's trending down -Recommend close  f/u. Consider outpt referral to General Surgeon    Class 2 severe obesity with serious comorbidity and body mass index (BMI) of 37.0 to 37.9 in adult -recommend diet/lifestyle modification    History of amputation of left hand Stable   Prediabetes Patient last A1c 5.8 a year ago, repeating A1c   Anxiety - As needed Xanax, continue home medication of Cymbalta    OSA (obstructive sleep apnea) Continue nightly CPAP   Depression, major, single episode, complete remission Continue Cymbalta    Essential (primary) hypertension Currently not on any medication, monitoring BP closely -BP currently stable    Consultants:  Procedures performed:   Disposition: Home Diet recommendation:  Regular diet DISCHARGE MEDICATION: Allergies as of 01/22/2024       Reactions   Hydrocodone-acetaminophen    Other reaction(s): VOMITING   Sulfa Antibiotics         Medication List     TAKE these medications    CLARITIN PO Take 10 mg by mouth daily.   DULoxetine  60 MG capsule Commonly known as: CYMBALTA  TAKE 1 CAPSULE BY MOUTH EVERY DAY What changed:  how much to take when to take this   estradiol  2 MG tablet Commonly known as: ESTRACE  Take 2 mg by mouth at bedtime.   fluticasone  50 MCG/ACT nasal spray Commonly known as: FLONASE  Place 2 sprays into both nostrils daily. What changed:  when to take this reasons to take this   ketorolac  10 MG tablet Commonly known as: TORADOL  Take 1 tablet (10 mg total) by mouth every 6 (six) hours as needed.   MULTI VITAMIN DAILY PO 1 tablet daily.   multivitamin-lutein Caps capsule Take 1 capsule by mouth daily.   ondansetron  4 MG tablet Commonly known  as: ZOFRAN  TAKE 1 TABLET BY MOUTH DAILY AS NEEDED FOR NAUSEA OR VOMITING.   PROBIOTIC DAILY PO Take 1 tablet by mouth at bedtime.   progesterone 200 MG capsule Commonly known as: PROMETRIUM Take 200 mg by mouth at bedtime.   tirzepatide  10 MG/0.5ML Pen Commonly known as:  ZEPBOUND  Inject 10 mg into the skin once a week. What changed: additional instructions   tolterodine 4 MG 24 hr capsule Commonly known as: DETROL LA Take 4 mg by mouth daily.        Follow-up Information     Simmons-Robinson, Makiera, MD Follow up in 2 week(s).   Specialty: Family Medicine Why: Hospital follow up Contact information: [address] [place] [address] [phone]                Discharge Exam: Fredricka Weights   01/20/24 1515  Weight: 81.3 kg   General exam: Awake, laying in bed, in nad Respiratory system: Normal respiratory effort, no wheezing Cardiovascular system: regular rate, s1, s2 Gastrointestinal system: Soft, nondistended, positive BS Central nervous system: CN2-12 grossly intact, strength intact Extremities: Perfused, no clubbing Skin: Normal skin turgor, no notable skin lesions seen Psychiatry: Mood normal // no visual hallucinations   Condition at discharge: fair  The results of significant diagnostics from this hospitalization (including imaging, microbiology, ancillary and laboratory) are listed below for reference.   Imaging Studies: NM Hepatobiliary Liver Func Result Date: 01/21/2024 EXAM: NM HEPATOBILIARY SCAN 01/21/2024 06:12:48 PM TECHNIQUE: RADIOPHARMACEUTICAL: 4.9 mCi Tc-55m mebrofenin  Dynamic images of the abdomen and pelvis were obtained in the anterior projection for 1 hour after intravenous administration of radiopharmaceutical. COMPARISON: None available. CLINICAL HISTORY: RUQ abdominal pain, biliary disease suspected, no prior imaging FINDINGS: Homogenous uptake within the liver. Normal clearance of the blood pool. Appropriate excretion into the biliary system. Activity is visualized in the small bowel. Activity is visualized in the gallbladder. IMPRESSION: 1. The common bile duct and cystic duct are patent. No acute cholecystitis. Electronically signed by: Greig Pique MD 01/21/2024 06:23 PM EST RP  Workstation: [id number]   CT ABDOMEN PELVIS W CONTRAST Result Date: 01/19/2024 CLINICAL DATA:  Right upper quadrant pain EXAM: CT ABDOMEN AND PELVIS WITH CONTRAST TECHNIQUE: Multidetector CT imaging of the abdomen and pelvis was performed using the standard protocol following bolus administration of intravenous contrast. RADIATION DOSE REDUCTION: This exam was performed according to the departmental dose-optimization program which includes automated exposure control, adjustment of the mA and/or kV according to patient size and/or use of iterative reconstruction technique. CONTRAST:  OMNIPAQUE  IOHEXOL  300 MG/ML  SOLN COMPARISON:  Ultrasound 01/19/2024 FINDINGS: Lower chest: Lung bases demonstrate no acute airspace disease. Hepatobiliary: No focal hepatic abnormality. No biliary distention. There appears to be mild diffuse gallbladder wall thickening versus trace pericholecystic fluid and suspicion of minimal fat stranding. No calcified stone. Pancreas: Unremarkable. No pancreatic ductal dilatation or surrounding inflammatory changes. Spleen: Normal in size without focal abnormality. Adrenals/Urinary Tract: Adrenal glands are unremarkable. Kidneys are normal, without renal calculi, focal lesion, or hydronephrosis. Bladder slightly thick walled but nearly empty. Stomach/Bowel: Stomach is within normal limits. Appendix not well seen but no right lower quadrant inflammation. No evidence of bowel wall thickening, distention, or inflammatory changes. Vascular/Lymphatic: No significant vascular findings are present. No enlarged abdominal or pelvic lymph nodes. Reproductive: Uterus and bilateral adnexa are unremarkable. Other: Negative for ascites or free air Musculoskeletal: No acute or suspicious osseous abnormality IMPRESSION: 1. Mild diffuse gallbladder wall thickening versus trace pericholecystic fluid and  suspicion of minimal fat stranding. No calcified stone. Preceding ultrasound does not confirm stones or  sonographic features suggesting acute cholecystitis. Suggest correlation with nuclear medicine hepatobiliary imaging. 2. Slightly thick-walled appearance of the urinary bladder which is nearly empty. Correlate with urinalysis to exclude cystitis. Electronically Signed   By: Luke Bun M.D.   On: 01/19/2024 19:21   US  Abdomen Limited RUQ (LIVER/GB) Result Date: 01/19/2024 CLINICAL DATA:  Right upper quadrant pain beginning this morning. EXAM: ULTRASOUND ABDOMEN LIMITED RIGHT UPPER QUADRANT COMPARISON:  None Available. FINDINGS: Gallbladder: No gallstones or wall thickening visualized. No sonographic Murphy sign noted by sonographer. Common bile duct: Diameter: 4 mm, within normal limits. Liver: No focal lesion identified. Within normal limits in parenchymal echogenicity. Portal vein is patent on color Doppler imaging with normal direction of blood flow towards the liver. Other: None. IMPRESSION: Negative. No hepatobiliary abnormality identified. Electronically Signed   By: Norleen DELENA Kil M.D.   On: 01/19/2024 17:56   DG Chest 2 View Result Date: 01/19/2024 CLINICAL DATA:  Chest pain EXAM: CHEST - 2 VIEW COMPARISON:  06/15/2012 FINDINGS: The heart size and mediastinal contours are within normal limits. Both lungs are clear. Partially visualized hardware in the right humerus. Surgical clips in the left axilla. IMPRESSION: No active cardiopulmonary disease. Electronically Signed   By: Luke Bun M.D.   On: 01/19/2024 15:43    Microbiology: Results for orders placed or performed in visit on 09/25/23  Microscopic Examination     Status: None   Collection Time: 09/25/23  3:09 PM   Urine  Result Value Ref Range Status   WBC, UA 0-5 0 - 5 /hpf Final   RBC, Urine 0-2 0 - 2 /hpf Final   Epithelial Cells (non renal) 0-10 0 - 10 /hpf Final   Bacteria, UA None seen None seen/Few Final  CULTURE, URINE COMPREHENSIVE     Status: Abnormal   Collection Time: 09/25/23  3:27 PM   Specimen: Urine   UR  Result  Value Ref Range Status   Urine Culture, Comprehensive Final report (A)  Final   Organism ID, Bacteria Comment (A)  Final    Comment: Beta hemolytic Streptococcus, group B 50,000-100,000 colony forming units per mL Penicillin and ampicillin are drugs of choice for treatment of beta-hemolytic streptococcal infections. Susceptibility testing of penicillins and other beta-lactam agents approved by the FDA for treatment of beta-hemolytic streptococcal infections need not be performed routinely because nonsusceptible isolates are extremely rare in any beta-hemolytic streptococcus and have not been reported for Streptococcus pyogenes (group A). (CLSI)    Organism ID, Bacteria Comment  Final    Comment: Mixed urogenital flora 10,000-25,000 colony forming units per mL     Labs: CBC: Recent Labs  Lab 01/19/24 1419 01/22/24 0206  WBC 13.6* 7.9  HGB 14.2 12.1  HCT 41.4 35.8*  MCV 85.7 86.9  PLT 388 357   Basic Metabolic Panel: Recent Labs  Lab 01/19/24 1419 01/20/24 1550 01/21/24 0531 01/22/24 0206  NA 137  --  138 141  K 3.6  --  3.6 3.7  CL 100  --  106 108  CO2 22  --  21* 27  GLUCOSE 138*  --  79 86  BUN 16  --  12 9  CREATININE 0.75 0.93 0.71 0.64  CALCIUM 10.1  --  8.4* 8.3*  MG  --  2.3  --   --   PHOS  --  4.2  --   --    Liver  Function Tests: Recent Labs  Lab 01/19/24 1436 01/21/24 0531 01/22/24 0206  AST 202* 35 23  ALT 118* 77* 54*  ALKPHOS 182* 104 94  BILITOT 1.0 0.3 0.2  PROT 7.8 5.8* 6.0*  ALBUMIN 4.3 2.9* 3.0*   CBG: Recent Labs  Lab 01/21/24 0914  GLUCAP 85    Discharge time spent: less than 30 minutes.  Signed: Garnette Pelt, MD [facility] 01/22/2024

## 2024-01-23 ENCOUNTER — Telehealth: Payer: Self-pay

## 2024-01-23 NOTE — Transitions of Care (Post Inpatient/ED Visit) (Signed)
 01/23/2024  Name: Anne Waters MRN: [id number] DOB: 1956/02/07  Today's TOC FU Call Status: Today's TOC FU Call Status:: Successful TOC FU Call Completed TOC FU Call Complete Date: 01/23/24  Patient's Name and Date of Birth confirmed. DOB, Name  Transition Care Management Follow-up Telephone Call Date of Discharge: 01/22/24 Discharge Facility: [facility]) Type of Discharge: Inpatient Admission Primary Inpatient Discharge Diagnosis:: Abdominal Pain How have you been since you were released from the hospital?: Better Any questions or concerns?: No  Items Reviewed: Did you receive and understand the discharge instructions provided?: Yes Medications obtained,verified, and reconciled?: Yes (Medications Reviewed) Any new allergies since your discharge?: No Dietary orders reviewed?: NA Do you have support at home?: Yes  Medications Reviewed Today: Medications Reviewed Today     Reviewed by Lavelle Charmaine NOVAK, LPN (Licensed Practical Nurse) on 01/23/24 at 1108  Med List Status: <None>   Medication Order Taking? Sig Documenting Provider Last Dose Status Informant  DULoxetine  (CYMBALTA ) 60 MG capsule [id number] No TAKE 1 CAPSULE BY MOUTH EVERY DAY  Patient taking differently: Take 60 mg by mouth at bedtime.   Sharma Coyer, MD 01/18/2024 Active Self, Pharmacy Records  estradiol  (ESTRACE ) 2 MG tablet [id number] No Take 2 mg by mouth at bedtime. [provider] 01/18/2024 Active Self, Pharmacy Records  fluticasone  (FLONASE ) 50 MCG/ACT nasal spray [id number] No Place 2 sprays into both nostrils daily.  Patient taking differently: Place 2 sprays into both nostrils as needed for allergies or rhinitis.   Gladis Mustard, FNP Unknown Active Self, Pharmacy Records           Med Note EFRAIM ALFREIDA CROME   Dju Jan 20, 2024  5:42 PM)    ketorolac  (TORADOL ) 10 MG tablet [id number]  Take 1 tablet (10 mg total) by mouth every 6 (six) hours as needed. Cindy Garnette POUR, MD  Active   Loratadine (CLARITIN PO) [id number] No Take 10 mg by mouth daily. [provider] 01/18/2024 Active Self, Pharmacy Records  Multiple Vitamin (MULTI VITAMIN DAILY PO) [id number] No 1 tablet daily.  [provider] 01/18/2024 Active Self, Pharmacy Records           Med Note EFRAIM ALFREIDA CROME   Dju Jan 20, 2024  5:37 PM)    multivitamin-lutein (OCUVITE-LUTEIN) CAPS capsule [id number] No Take 1 capsule by mouth daily. [provider] 01/18/2024 Active Self, Pharmacy Records  ondansetron  (ZOFRAN ) 4 MG tablet [id number] No TAKE 1 TABLET BY MOUTH DAILY AS NEEDED FOR NAUSEA OR VOMITING. Simmons-Robinson, Coyer, MD Unknown Active Self, Pharmacy Records  Probiotic Product (PROBIOTIC DAILY PO) [id number] No Take 1 tablet by mouth at bedtime. [provider] 01/18/2024 Active Self, Pharmacy Records  progesterone (PROMETRIUM) 200 MG capsule [id number] No Take 200 mg by mouth at bedtime. [provider] 01/18/2024 Active Self, Pharmacy Records  tirzepatide  (ZEPBOUND ) 10 MG/0.5ML Pen [id number] No Inject 10 mg into the skin once a week.  Patient taking differently: Inject 10 mg into the skin once a week. Sunday Night   Simmons-Robinson, Coyer, MD 01/14/2024 Active Self, Pharmacy Records  tolterodine (DETROL LA) 4 MG 24 hr capsule [id number] No Take 4 mg by mouth daily. [provider] 01/18/2024 Active Self, Pharmacy Records            Home Care and Equipment/Supplies: Were Home Health Services Ordered?: NA Any new equipment or medical supplies ordered?: NA  Functional Questionnaire: Do you need assistance with bathing/showering or dressing?: No Do you need assistance with meal preparation?:  No Do you need assistance with eating?: No Do you have difficulty maintaining continence: No Do you need assistance with getting out of bed/getting out of a chair/moving?: No Do you have difficulty managing or taking your medications?:  No  Follow up appointments reviewed: PCP Follow-up appointment confirmed?: Yes Date of PCP follow-up appointment?: 02/05/24 Follow-up Provider: [facility] Follow-up appointment confirmed?: (S) NA (discuss with provider referral to surgeon) Do you need transportation to your follow-up appointment?: No Do you understand care options if your condition(s) worsen?: Yes-patient verbalized understanding    SIGNATURE Charmaine Bloodgood, LPN [facility] Advisor [place] [facility] You Are. We Are. [facility] Direct Dial [phone]

## 2024-01-26 ENCOUNTER — Ambulatory Visit: Admitting: Obstetrics

## 2024-02-05 ENCOUNTER — Ambulatory Visit (INDEPENDENT_AMBULATORY_CARE_PROVIDER_SITE_OTHER): Admitting: Family Medicine

## 2024-02-05 ENCOUNTER — Encounter: Payer: Self-pay | Admitting: Family Medicine

## 2024-02-05 ENCOUNTER — Encounter: Admitting: Family Medicine

## 2024-02-05 ENCOUNTER — Telehealth: Payer: Self-pay

## 2024-02-05 VITALS — BP 106/72 | HR 83 | Ht 66.0 in | Wt 181.7 lb

## 2024-02-05 DIAGNOSIS — R112 Nausea with vomiting, unspecified: Secondary | ICD-10-CM | POA: Diagnosis not present

## 2024-02-05 DIAGNOSIS — E66812 Obesity, class 2: Secondary | ICD-10-CM

## 2024-02-05 DIAGNOSIS — Z6837 Body mass index (BMI) 37.0-37.9, adult: Secondary | ICD-10-CM | POA: Diagnosis not present

## 2024-02-05 DIAGNOSIS — R7401 Elevation of levels of liver transaminase levels: Secondary | ICD-10-CM | POA: Diagnosis not present

## 2024-02-05 DIAGNOSIS — K828 Other specified diseases of gallbladder: Secondary | ICD-10-CM

## 2024-02-05 DIAGNOSIS — R1011 Right upper quadrant pain: Secondary | ICD-10-CM

## 2024-02-05 DIAGNOSIS — R7303 Prediabetes: Secondary | ICD-10-CM | POA: Diagnosis not present

## 2024-02-05 DIAGNOSIS — I10 Essential (primary) hypertension: Secondary | ICD-10-CM

## 2024-02-05 DIAGNOSIS — Z13220 Encounter for screening for lipoid disorders: Secondary | ICD-10-CM

## 2024-02-05 DIAGNOSIS — G4733 Obstructive sleep apnea (adult) (pediatric): Secondary | ICD-10-CM

## 2024-02-05 MED ORDER — ZEPBOUND 12.5 MG/0.5ML ~~LOC~~ SOAJ: 12.5000 mg | SUBCUTANEOUS | 1 refills | Status: AC

## 2024-02-05 MED ORDER — TIRZEPATIDE 12.5 MG/0.5ML ~~LOC~~ SOAJ: 12.5000 mg | SUBCUTANEOUS | 2 refills | Status: DC

## 2024-02-05 NOTE — Patient Instructions (Addendum)
 To keep you healthy, please keep in mind the following health maintenance items that you are due for:   Please set up a nurse visit for the pneumococcal vaccine   Health Maintenance Due  Topic Date Due   Pneumococcal Vaccine: 50+ Years (1 of 1 - PCV) Never done   Medicare Annual Wellness (AWV)  09/28/2023     Best Wishes,   Dr. Lang

## 2024-02-05 NOTE — Telephone Encounter (Signed)
 Referral for Gen surg placed

## 2024-02-05 NOTE — Telephone Encounter (Signed)
 Copied from CRM [id number]. Topic: General - Other >> Feb 05, 2024 11:46 AM Edsel HERO wrote: Patient returned call to office and advised that she agreed to use general surgery referral as follow up for hospital visit.

## 2024-02-05 NOTE — Progress Notes (Signed)
 "  Established Patient Office Visit  Patient ID: Anne Waters, female    DOB: Dec 28, 1955  Age: 68 y.o. MRN: [id number] PCP: Sharma Coyer, MD  Chief Complaint  Patient presents with   Follow-up    Patient reports doing well since hospital visit, no abd pain. Recommended that pt have repeat labs    Weight Check    Patient would like to discuss increase of zepbound  dose, has met deductible and requests refill today if able     Subjective:     HPI  Discussed the use of AI scribe software for clinical note transcription with the patient, who gave verbal consent to proceed.  History of Present Illness  TOC call completed  01/23/24 Admitted 01/19/24  Discharged 01/22/24   CT showed gallbladder wall thickening  Pt presented to ED with vomiting and abdominal pain  She was evaluated by general surgery and recommended for HIDA scan that showed patent common bile duct and cystic duct without acute cholecystitis      Anne Waters is a 68 year old female who presents for a hospital follow-up after an admission for vomiting and abdominal pain.  She was admitted to the hospital from December 12th to December 15th due to vomiting and abdominal pain. An abdominal CT scan revealed gallbladder wall thickening. She was evaluated by general surgery and underwent a HIDA scan, which showed a patent common bile duct and cystic ducts without acute cholecystitis. Since discharge, she feels better.  She is currently on tirzepatide  10 mg weekly for weight management. Her weight has decreased from 205 pounds to 181 pounds over the past three months, and her BMI has decreased from 38 to 29. She notes that after about a month on each dosage, the 'food noise' returns by the end of the week.  She has a family history of gallbladder issues, with her father having had a ruptured gallbladder and needing removal, and her husband and youngest child also having had their gallbladders removed.  She experienced severe abdominal pain radiating to her back, which she initially thought was indigestion. Her daughter suggested it might be gallbladder-related. She went to the emergency department where she underwent an EKG, CT scan, and ultrasound.  Her liver enzymes were initially high during her hospital stay but returned to normal levels before discharge. She is interested in having repeat labs, including a CMP and CBC, to monitor her condition.  She uses a CPAP machine for sleep apnea but has been experiencing issues with dry mouth and waking up tired.     ROS    Objective:     BP 106/72 (BP Location: Left Arm, Patient Position: Sitting, Cuff Size: Normal)   Pulse 83   Ht 5' 6 (1.676 m)   Wt 181 lb 11.2 oz (82.4 kg)   SpO2 98%   BMI 29.33 kg/m   BP Readings from Last 3 Encounters:  02/05/24 106/72  01/22/24 139/65  11/06/23 123/70   Wt Readings from Last 3 Encounters:  02/05/24 181 lb 11.2 oz (82.4 kg)  01/20/24 179 lb 3.7 oz (81.3 kg)  11/06/23 205 lb (93 kg)     Physical Exam Vitals reviewed.  Constitutional:      General: She is not in acute distress.    Appearance: Normal appearance. She is not ill-appearing.  Cardiovascular:     Rate and Rhythm: Normal rate and regular rhythm.  Pulmonary:     Effort: Pulmonary effort is normal. No respiratory distress.  Breath sounds: No wheezing, rhonchi or rales.  Abdominal:     General: Bowel sounds are normal.     Palpations: Abdomen is soft. There is no hepatomegaly, splenomegaly or mass.     Tenderness: There is no abdominal tenderness.  Neurological:     Mental Status: She is alert and oriented to person, place, and time.  Psychiatric:        Mood and Affect: Mood normal.        Behavior: Behavior normal.      No results found for any visits on 02/05/24.  Last CBC Lab Results  Component Value Date   WBC 7.9 01/22/2024   HGB 12.1 01/22/2024   HCT 35.8 (L) 01/22/2024   MCV 86.9 01/22/2024   MCH  29.4 01/22/2024   RDW 12.8 01/22/2024   PLT 357 01/22/2024   Last metabolic panel Lab Results  Component Value Date   GLUCOSE 86 01/22/2024   NA 141 01/22/2024   K 3.7 01/22/2024   CL 108 01/22/2024   CO2 27 01/22/2024   BUN 9 01/22/2024   CREATININE 0.64 01/22/2024   GFRNONAA >60 01/22/2024   CALCIUM 8.3 (L) 01/22/2024   PHOS 4.2 01/20/2024   PROT 6.0 (L) 01/22/2024   ALBUMIN 3.0 (L) 01/22/2024   LABGLOB 2.6 10/05/2020   AGRATIO 1.7 10/05/2020   BILITOT 0.2 01/22/2024   ALKPHOS 94 01/22/2024   AST 23 01/22/2024   ALT 54 (H) 01/22/2024   ANIONGAP 6 01/22/2024   Last lipids Lab Results  Component Value Date   CHOL 190 10/05/2020   HDL 51 10/05/2020   LDLCALC 118 (H) 10/05/2020   TRIG 116 10/05/2020   CHOLHDL 3.7 10/05/2020   Last hemoglobin A1c Lab Results  Component Value Date   HGBA1C 5.6 10/05/2020   Last thyroid  functions Lab Results  Component Value Date   TSH 2.790 10/05/2020   FREET4 0.93 03/10/2008   Last vitamin D No results found for: 25OHVITD2, 25OHVITD3, VD25OH Last vitamin B12 and Folate Lab Results  Component Value Date   VITAMINB12 487 03/10/2008   FOLATE  03/10/2008    19.3 (NOTE)  Reference Ranges        Deficient:       0.4 - 3.3 ng/mL        Indeterminate:   3.4 - 5.4 ng/mL        Normal:              > 5.4 ng/mL      The 10-year ASCVD risk score (Arnett DK, et al., 2019) is: 4.5%    Assessment & Plan:   Problem List Items Addressed This Visit     Abdominal pain - Primary   Relevant Orders   CMP14+EGFR   Class 2 severe obesity with serious comorbidity and body mass index (BMI) of 37.0 to 37.9 in adult   Relevant Medications   tirzepatide  (MOUNJARO ) 12.5 MG/0.5ML Pen   Depression, major, single episode, complete remission   Essential (primary) hypertension   OSA (obstructive sleep apnea)   Relevant Orders   For home use only DME continuous positive airway pressure (CPAP)   Prediabetes   Relevant Orders   Hemoglobin  A1c   Other Visit Diagnoses       Nausea and vomiting, unspecified vomiting type       Relevant Orders   CBC     Elevated transaminase level       Relevant Orders   CMP14+EGFR     Screening for  lipid disorders       Relevant Orders   Lipid panel       Assessment & Plan Post-hospitalization follow-up for biliary colic associated with emesis  Elevated Liver enzymes  Abdominal pain and emesis has resolved  Recent hospitalization for biliary colic with vomiting and abdominal pain. Abdominal CT showed gallbladder wall thickening. HIDA scan showed patent common bile duct and cystic ducts without acute cholecystitis. Hospitalist believes it was a once-and-done event, possibly related to gallstone passage. No current justification for cholecystectomy. - Offered referral to general surgery for outpatient follow-up - Repeated liver enzymes  History of Class II obesity, BMI 38, now down to 29  Chronic BMI decreased from 38 to 29, indicating a shift from class 2 obesity to overweight category. Currently on tirzepatide  10 mg weekly for weight management. Reports significant improvement in symptoms and quality of life. Desires to lose an additional 30 pounds and is considering increasing tirzepatide  to 12.5 mg. Discussed chronic nature of tirzepatide  and potential for long-term use. - Increased tirzepatide  to 12.5 mg weekly - Repeated CBC, CMP, A1c, and cholesterol panel  Essential hypertension Chronic  -continue lifestyle management   General Health Maint Lipid Screening ordered   Obstructive sleep apnea Chronic  Continues to use CPAP machine but reports issues with dry mouth and poor sleep quality. Considering different mask options. Reports snoring and waking up tired. - Placed order with [employer] [facility] for CPAP mask adjustment  Elevated transaminase level Acute, improved levels since 01/19/24  Previous hospitalization showed elevated AST level of 202, which decreased to 23 within  three days. Likely related to inflammation from biliary colic. - Repeated liver enzymes  General health maintenance Discussed pneumococcal vaccine for seasonal protection. No current upper respiratory symptoms. - Administer pneumococcal vaccine   Return in about 4 months (around 06/05/2024) for  Weight MGMT(Zepbound ).    Rockie Agent, MD [facility]   "

## 2024-02-05 NOTE — Addendum Note (Signed)
 Addended by: SIMMONS-ROBINSON, Earle Burson L on: 02/05/2024 02:46 PM   Modules accepted: Orders

## 2024-02-05 NOTE — Telephone Encounter (Signed)
 Called and LVM requesting patient call the office back. Dr. Lang wanted to know if patient would like referral for General surgery to serve as a follow up after her hospital visit on 01/19/2024.  E2C2 may obtain information if patient returns call    Have also ordered and faxed all necessary documents for new CPAP for patient.

## 2024-02-06 LAB — LIPID PANEL
Chol/HDL Ratio: 4.2 ratio (ref 0.0–4.4)
Cholesterol, Total: 169 mg/dL (ref 100–199)
HDL: 40 mg/dL
LDL Chol Calc (NIH): 114 mg/dL — ABNORMAL HIGH (ref 0–99)
Triglycerides: 82 mg/dL (ref 0–149)
VLDL Cholesterol Cal: 15 mg/dL (ref 5–40)

## 2024-02-06 LAB — CMP14+EGFR
ALT: 16 IU/L (ref 0–32)
AST: 16 IU/L (ref 0–40)
Albumin: 4 g/dL (ref 3.9–4.9)
Alkaline Phosphatase: 83 IU/L (ref 49–135)
BUN/Creatinine Ratio: 24 (ref 12–28)
BUN: 20 mg/dL (ref 8–27)
Bilirubin Total: 0.3 mg/dL (ref 0.0–1.2)
CO2: 23 mmol/L (ref 20–29)
Calcium: 9.5 mg/dL (ref 8.7–10.3)
Chloride: 105 mmol/L (ref 96–106)
Creatinine, Ser: 0.83 mg/dL (ref 0.57–1.00)
Globulin, Total: 2.5 g/dL (ref 1.5–4.5)
Glucose: 78 mg/dL (ref 70–99)
Potassium: 4.8 mmol/L (ref 3.5–5.2)
Sodium: 141 mmol/L (ref 134–144)
Total Protein: 6.5 g/dL (ref 6.0–8.5)
eGFR: 78 mL/min/1.73

## 2024-02-06 LAB — CBC
Hematocrit: 40.9 % (ref 34.0–46.6)
Hemoglobin: 13.3 g/dL (ref 11.1–15.9)
MCH: 29.6 pg (ref 26.6–33.0)
MCHC: 32.5 g/dL (ref 31.5–35.7)
MCV: 91 fL (ref 79–97)
Platelets: 412 x10E3/uL (ref 150–450)
RBC: 4.49 x10E6/uL (ref 3.77–5.28)
RDW: 12.6 % (ref 11.7–15.4)
WBC: 7.4 x10E3/uL (ref 3.4–10.8)

## 2024-02-06 LAB — HEMOGLOBIN A1C
Est. average glucose Bld gHb Est-mCnc: 103 mg/dL
Hgb A1c MFr Bld: 5.2 % (ref 4.8–5.6)

## 2024-02-12 ENCOUNTER — Ambulatory Visit: Payer: Self-pay | Admitting: Family Medicine

## 2024-02-19 ENCOUNTER — Telehealth: Payer: Self-pay

## 2024-02-19 ENCOUNTER — Encounter: Payer: Self-pay | Admitting: *Deleted

## 2024-02-19 ENCOUNTER — Other Ambulatory Visit (HOSPITAL_COMMUNITY): Payer: Self-pay

## 2024-02-19 NOTE — Telephone Encounter (Signed)
 Pharmacy Patient Advocate Encounter   Received notification from CoverMyMeds that prior authorization for Zepbound  12.5mg /0.59ml is due for renewal.   Insurance verification completed.   The patient is insured through [employer].  Action: PA required; PA submitted to above mentioned insurance via Latent Key/confirmation #/EOC [id number] Status is pending

## 2024-02-19 NOTE — Telephone Encounter (Signed)
 Pharmacy Patient Advocate Encounter  Received notification from [employer] that Prior Authorization for Zepbound  12.5mg /0.19ml has been APPROVED from 02/19/24 to 02/06/25. Ran test claim, Copay is $100. This test claim was processed through [facility]- copay amounts may vary at other pharmacies due to pharmacy/plan contracts, or as the patient moves through the different stages of their insurance plan.   PA #/Case ID/Reference #: [id number]  Extended approval # [id number]  Member will only be able to get 1 month at a time.

## 2024-02-21 ENCOUNTER — Telehealth: Payer: Self-pay | Admitting: Surgery

## 2024-02-21 ENCOUNTER — Ambulatory Visit: Admitting: Surgery

## 2024-02-21 ENCOUNTER — Encounter: Payer: Self-pay | Admitting: Surgery

## 2024-02-21 VITALS — BP 110/75 | HR 90 | Temp 98.1°F | Ht 65.0 in | Wt 178.2 lb

## 2024-02-21 DIAGNOSIS — K8020 Calculus of gallbladder without cholecystitis without obstruction: Secondary | ICD-10-CM | POA: Diagnosis not present

## 2024-02-21 NOTE — Patient Instructions (Signed)
 You have requested to have your gallbladder removed. This will be done at [facility] with Dr. Desiderio.   If you are on any injectable weight loss medication, you will need to stop taking your GLP-1 injectable (weight loss) medications 8 days before your surgery to avoid any complications with anesthesia.    You will most likely be out of work 1-2 weeks for this surgery.  If you have FMLA or disability paperwork that needs filled out you may drop this off at our office or this can be faxed to [phone].   You will return after your post-op appointment with a lifting restriction for approximately 4 more weeks.   You will be able to eat anything you would like to following surgery. But, start by eating a bland diet and advance this as tolerated. The Gallbladder diet is below, please go as closely by this diet as possible prior to surgery to avoid any further attacks.   Please see the (blue)pre-care form that you have been given today. You will receive several phone calls from [facility] prior to your surgery date.   If you have any questions, please call our office.   Laparoscopic Cholecystectomy Laparoscopic cholecystectomy is surgery to remove the gallbladder. The gallbladder is located in the upper right part of the abdomen, behind the liver. It is a storage sac for bile, which is produced in the liver. Bile aids in the digestion and absorption of fats. Cholecystectomy is often done for inflammation of the gallbladder (cholecystitis). This condition is usually caused by a buildup of gallstones (cholelithiasis) in the gallbladder. Gallstones can block the flow of bile, and that can result in inflammation and pain. In severe cases, emergency surgery may be required. If emergency surgery is not required, you will have time to prepare for the procedure. Laparoscopic surgery is an alternative to open surgery. Laparoscopic surgery has a shorter recovery time. Your common bile duct may also  need to be examined during the procedure. If stones are found in the common bile duct, they may be removed. LET [facility] CARE PROVIDER KNOW ABOUT: Any allergies you have. All medicines you are taking, including vitamins, herbs, eye drops, creams, and over-the-counter medicines. Previous problems you or members of your family have had with the use of anesthetics. Any blood disorders you have. Previous surgeries you have had.  Any medical conditions you have. RISKS AND COMPLICATIONS Generally, this is a safe procedure. However, problems may occur, including: Infection. Bleeding. Allergic reactions to medicines. Damage to other structures or organs. A stone remaining in the common bile duct. A bile leak from the cyst duct that is clipped when your gallbladder is removed. The need to convert to open surgery, which requires a larger incision in the abdomen. This may be necessary if your surgeon thinks that it is not safe to continue with a laparoscopic procedure. BEFORE THE PROCEDURE Ask your health care provider about: Changing or stopping your regular medicines. This is especially important if you are taking diabetes medicines or blood thinners. Taking medicines such as aspirin and ibuprofen . These medicines can thin your blood. Do not take these medicines before your procedure if your health care provider instructs you not to. Follow instructions from your health care provider about eating or drinking restrictions. Let your health care provider know if you develop a cold or an infection before surgery. Plan to have someone take you home after the procedure. Ask your health care provider how your surgical site will be  marked or identified. You may be given antibiotic medicine to help prevent infection. PROCEDURE To reduce your risk of infection: Your health care team will wash or sanitize their hands. Your skin will be washed with soap. An IV tube may be inserted into one of your  veins. You will be given a medicine to make you fall asleep (general anesthetic). A breathing tube will be placed in your mouth. The surgeon will make several small cuts (incisions) in your abdomen. A thin, lighted tube (laparoscope) that has a tiny camera on the end will be inserted through one of the small incisions. The camera on the laparoscope will send a picture to a TV screen (monitor) in the operating room. This will give the surgeon a good view inside your abdomen. A gas will be pumped into your abdomen. This will expand your abdomen to give the surgeon more room to perform the surgery. Other tools that are needed for the procedure will be inserted through the other incisions. The gallbladder will be removed through one of the incisions. After your gallbladder has been removed, the incisions will be closed with stitches (sutures), staples, or skin glue. Your incisions may be covered with a bandage (dressing). The procedure may vary among health care providers and hospitals. AFTER THE PROCEDURE Your blood pressure, heart rate, breathing rate, and blood oxygen level will be monitored often until the medicines you were given have worn off. You will be given medicines as needed to control your pain.   This information is not intended to replace advice given to you by your health care provider. Make sure you discuss any questions you have with your health care provider.   Document Released: 01/24/2005 Document Revised: 10/15/2014 Document Reviewed: 09/05/2012 Elsevier Interactive Patient Education 2016 Elsevier Inc.     Low-Fat Diet for Gallbladder Conditions A low-fat diet can be helpful if you have pancreatitis or a gallbladder condition. With these conditions, your pancreas and gallbladder have trouble digesting fats. A healthy eating plan with less fat will help rest your pancreas and gallbladder and reduce your symptoms. WHAT DO I NEED TO KNOW ABOUT THIS DIET? Eat a low-fat  diet. Reduce your fat intake to less than 20-30% of your total daily calories. This is less than 50-60 g of fat per day. Remember that you need some fat in your diet. Ask your dietician what your daily goal should be. Choose nonfat and low-fat healthy foods. Look for the words nonfat, low fat, or fat free. As a guide, look on the label and choose foods with less than 3 g of fat per serving. Eat only one serving. Avoid alcohol. Do not smoke. If you need help quitting, talk with your health care provider. Eat small frequent meals instead of three large heavy meals. WHAT FOODS CAN I EAT? Grains Include healthy grains and starches such as potatoes, wheat bread, fiber-rich cereal, and brown rice. Choose whole grain options whenever possible. In adults, whole grains should account for 45-65% of your daily calories.  Fruits and Vegetables Eat plenty of fruits and vegetables. Fresh fruits and vegetables add fiber to your diet. Meats and Other Protein Sources Eat lean meat such as chicken and pork. Trim any fat off of meat before cooking it. Eggs, fish, and beans are other sources of protein. In adults, these foods should account for 10-35% of your daily calories. Dairy Choose low-fat milk and dairy options. Dairy includes fat and protein, as well as calcium.  Fats and Oils Limit high-fat  foods such as fried foods, sweets, baked goods, sugary drinks.  Other Creamy sauces and condiments, such as mayonnaise, can add extra fat. Think about whether or not you need to use them, or use smaller amounts or low fat options. WHAT FOODS ARE NOT RECOMMENDED? High fat foods, such as: [employer]. Ice cream. French toast. Sweet rolls. Pizza. Cheese bread. Foods covered with batter, butter, creamy sauces, or cheese. Fried foods. Sugary drinks and desserts. Foods that cause gas or bloating   This information is not intended to replace advice given to you by your health care provider. Make sure you  discuss any questions you have with your health care provider.   Document Released: 01/29/2013 Document Reviewed: 01/29/2013 Elsevier Interactive Patient Education 2016 [employer].     Gallbladder Problems: Eating Plan  Having high blood cholesterol, obesity, an inactive lifestyle, an unhealthy diet, or diabetes can put you at risk for getting gallstones. If you have a gallbladder problem, you may have trouble digesting fats. You may also have symptoms when you eat a lot of fat. Eating a low-fat diet can help lessen your symptoms. It may be helpful before and after having surgery to have your gallbladder taken out. Your health care provider may recommend that you work with an expert in healthy eating called a dietitian. They can help you lower the amount of fat in your diet. What are tips for following this plan? General guidelines Limit how much fat you have to less than 30% of your total daily calories. If you eat around 1,800 calories each day, you'll be eating less than 60 grams (g) of fat a day. Fat is an important part of a healthy diet. Eating a low-fat diet can make it hard to keep a healthy body weight. Ask your dietitian how much fat, calories, and other nutrients you need each day. Eat small meals often throughout the day instead of 3 large meals. Drink at least 8-10 cups (1.9-2.4 L) of fluid a day. If you drink alcohol: Limit how much you have to: 0-1 drink a day if you're female. 0-2 drinks a day if you're female. Know how much alcohol is in your drink. In the [place] one drink is one 12 oz bottle of beer (355 mL), one 5 oz glass of wine (148 mL), or one 1 oz glass of hard liquor (44 mL). Reading food labels  Check nutrition facts on food labels for how much fat is in a serving. Choose foods with less than 3 grams of fat per serving. Shopping Choose nonfat and low-fat healthy foods. Look for the words nonfat, low-fat, or fat-free. Avoid buying processed or prepackaged  foods. Cooking Cook using low-fat methods, such as baking, broiling, grilling, or boiling. Cook with small amounts of healthy fats, such as olive oil, grapeseed oil, canola oil, avocado oil, or sunflower oil. What foods are recommended?  All fresh, frozen, or canned fruits and vegetables. Whole grains. Low-fat or nonfat (skim) milk and yogurt. Lean meat, skinless poultry, fish, eggs, and beans. Low-fat protein supplement powders or drinks. Spices and herbs. The items listed above may not be all the foods and drinks you can have. Talk with a dietitian to learn more. What foods are not recommended? High-fat foods. These include baked goods, fast food, fatty cuts of meat, ice cream, french toast, sweet rolls, pizza, cheese bread, foods covered with butter, creamy sauces, or cheese. Fried foods. These include french fries, tempura, battered fish, breaded chicken, fried breads, and sweets. Foods  that cause bloating and gas. The items listed above may not be all the foods and drinks you should avoid. Talk with a dietitian to learn more. This information is not intended to replace advice given to you by your health care provider. Make sure you discuss any questions you have with your health care provider. Document Revised: 05/02/2023 Document Reviewed: 05/02/2023 Elsevier Patient Education  2025 [employer].

## 2024-02-21 NOTE — Progress Notes (Signed)
 " 02/21/2024  Reason for Visit:  Symptomatic cholelithiasis  Requesting Provider:  Rockie Agent, MD  History of Present Illness: Anne Waters is a 69 y.o. female presenting for evaluation of symptomatic cholelithiasis.  The patient was admitted on 01/19/2024 with epigastric and right upper quadrant pain.  Her total bilirubin was 1.0, AST 202, ALT 118, alkaline phosphatase 182.  She initially had ultrasound of the right upper quadrant which did not show any gallstones or wall thickening.  However on my personal review of the images, I think there is probably sludge.  This was followed by a CT scan of the abdomen and pelvis which shows some mild gallbladder wall thickening and some minimal fat stranding concerning for possible cholecystitis.  This was followed by HIDA scan on 01/21/2024 which showed a patent cystic duct and common bile duct.  The patient reports that her pain started on the same day 01/19/2024 and was quite severe associated with nausea and vomiting.  The pain was in the epigastric and right upper quadrant areas and radiated to the back.  However once she was in the emergency room, the pain started easing up.  Given the negative HIDA scan, no surgery was done but she was recommending to follow-up with general surgery team.  During her admission, her LFTs nearly normalized.  She saw her PCP on 02/05/2024 and she was referred to general surgery for further evaluation and management.  Since her visit to the emergency room, she has been doing well and denies any further episodes.  Of note, the patient is currently on a GLP-1 medication.  Also her daughter and her father both required cholecystectomies.  Past Medical History: Past Medical History:  Diagnosis Date   Allergy    Anxiety 06/2010   PTSD   Arthritis ?   Cannot recall   Depression    Sleep apnea ?   Cannot recall     Past Surgical History: Past Surgical History:  Procedure Laterality Date   arm and hand  surgery Bilateral    2010 left; 2011 right    Home Medications: Prior to Admission medications  Medication Sig Start Date End Date Taking? Authorizing Provider  DULoxetine  (CYMBALTA ) 60 MG capsule TAKE 1 CAPSULE BY MOUTH EVERY DAY Patient taking differently: Take 60 mg by mouth at bedtime. 01/01/24  Yes Simmons-Robinson, Makiera, MD  estradiol  (ESTRACE ) 2 MG tablet Take 2 mg by mouth at bedtime. 04/21/23  Yes [provider]  fluticasone  (FLONASE ) 50 MCG/ACT nasal spray Place 2 sprays into both nostrils daily. Patient taking differently: Place 2 sprays into both nostrils as needed for allergies or rhinitis. 02/12/20  Yes Martin, Mary-Margaret, FNP  Loratadine (CLARITIN PO) Take 10 mg by mouth daily.   Yes [provider]  Multiple Vitamin (MULTI VITAMIN DAILY PO) 1 tablet daily.  04/21/08  Yes [provider]  multivitamin-lutein (OCUVITE-LUTEIN) CAPS capsule Take 1 capsule by mouth daily.   Yes [provider]  ondansetron  (ZOFRAN ) 4 MG tablet TAKE 1 TABLET BY MOUTH DAILY AS NEEDED FOR NAUSEA OR VOMITING. 10/04/23  Yes Simmons-Robinson, Makiera, MD  Probiotic Product (PROBIOTIC DAILY PO) Take 1 tablet by mouth at bedtime.   Yes [provider]  progesterone (PROMETRIUM) 200 MG capsule Take 200 mg by mouth at bedtime. 04/21/23  Yes [provider]  tirzepatide  (ZEPBOUND ) 12.5 MG/0.5ML Pen Inject 12.5 mg into the skin once a week. Patient taking differently: Inject 12.5 mg into the skin once a week. Taking 10 mg weekly 02/05/24  Yes Simmons-Robinson, Makiera, MD  tolterodine (DETROL LA) 4 MG 24 hr capsule Take 4 mg by mouth daily. 06/14/19  Yes [provider]  ketorolac  (TORADOL ) 10 MG tablet Take 1 tablet (10 mg total) by mouth every 6 (six) hours as needed. Patient not taking: Reported on 02/21/2024 01/22/24   Cindy Garnette POUR, MD    Allergies: Allergies[1]  Social History:  reports that she has never smoked. She has never used  smokeless tobacco. She reports that she does not drink alcohol and does not use drugs.   Family History: Family History  Problem Relation Age of Onset   Cataracts Mother    Arthritis Mother    Macular degeneration Mother    Vision loss Mother    Hearing loss Mother    Heart attack Maternal Grandmother    Hypertension Maternal Grandmother    Hypertension Maternal Grandfather    CVA Maternal Grandfather    Alzheimer's disease Maternal Grandfather    CVA Paternal Grandmother    Alzheimer's disease Paternal Grandmother    Hypertension Paternal Grandmother    Heart attack Paternal Grandfather    Heart disease Father    Hearing loss Father    Healthy Daughter    Healthy Daughter     Review of Systems: Review of Systems  Constitutional:  Negative for chills and fever.  HENT:  Negative for hearing loss.   Respiratory:  Negative for shortness of breath.   Cardiovascular:  Negative for chest pain.  Gastrointestinal:  Negative for abdominal pain, nausea and vomiting.  Genitourinary:  Negative for dysuria.  Musculoskeletal:  Negative for myalgias.  Skin:  Negative for rash.  Neurological:  Negative for dizziness.  Psychiatric/Behavioral:  Negative for depression.     Physical Exam BP 110/75   Pulse 90   Temp 98.1 F (36.7 C) (Oral)   Ht 5' 5 (1.651 m)   Wt 178 lb 3.2 oz (80.8 kg)   SpO2 96%   BMI 29.65 kg/m  CONSTITUTIONAL: No acute distress, well-nourished HEENT:  Normocephalic, atraumatic, extraocular motion intact. NECK: Trachea is midline, and there is no jugular venous distension.  RESPIRATORY:  Lungs are clear, and breath sounds are equal bilaterally. Normal respiratory effort without pathologic use of accessory muscles. CARDIOVASCULAR: Heart is regular without murmurs, gallops, or rubs. GI: The abdomen is soft, nondistended, currently nontender to palpation.  Negative Murphy's sign.  MUSCULOSKELETAL:  Normal muscle strength and tone in all four extremities.  No  peripheral edema or cyanosis. SKIN: Skin turgor is normal. There are no pathologic skin lesions.  NEUROLOGIC:  Motor and sensation is grossly normal.  Cranial nerves are grossly intact. PSYCH:  Alert and oriented to person, place and time. Affect is normal.  Laboratory Analysis: Labs from 02/05/2024: Sodium 141, potassium 4.8, chloride 105, CO2 23, BUN 20, creatinine 0.83.  Total bilirubin 0.3, AST 16, ALT 16, alkaline phosphatase 83, albumin 4.  WBC 7.4, hemoglobin 13.3, hematocrit 40.9, platelets 412.  Hemoglobin A1c 5.2.  Imaging: Ultrasound RUQ on 01/19/2024: IMPRESSION: Negative. No hepatobiliary abnormality identified.  CT abdomen/pelvis on 01/19/2024: IMPRESSION: 1. Mild diffuse gallbladder wall thickening versus trace pericholecystic fluid and suspicion of minimal fat stranding. No calcified stone. Preceding ultrasound does not confirm stones or sonographic features suggesting acute cholecystitis. Suggest correlation with nuclear medicine hepatobiliary imaging. 2. Slightly thick-walled appearance of the urinary bladder which is nearly empty. Correlate with urinalysis to exclude cystitis.  HIDA scan on 01/21/2024: IMPRESSION: 1. The common bile duct and cystic duct are patent. No  acute cholecystitis.  Assessment and Plan: This is a 69 y.o. female with symptomatic cholelithiasis  - Discussed with the patient the findings on her imaging studies.  Overall there was no specific gallstones on her ultrasound but I do think there is likely sludge.  If anything, given that the LFTs did increase on initial presentation and subsequently improved as her pain resolved, I do think she probably either passed a stone or passed some sludge through the common bile duct resulting in the LFT changes.  Given this, discussed with patient recommendation for cholecystectomy to prevent further episodes which could potentially be worse and involve pancreatitis as well.  The patient is in agreement. -  Discussed with her that the plan for robotic assisted cholecystectomy reviewed with her the surgery at length including the planned incisions, risks of bleeding, infection, injury to surrounding structures, the use of ICG to better evaluate the biliary anatomy, that this will be an outpatient procedure, postoperative pain control, activity restrictions, and she is willing to proceed. - Patient will be scheduled for surgery based on her preference on 04/23/2024.  She will follow-up with me early March for H&P update.  All of her questions have been answered.  I spent 55 minutes dedicated to the care of this patient on the date of this encounter to include pre-visit review of records, face-to-face time with the patient discussing diagnosis and management, and any post-visit coordination of care.   Aloysius Sheree Plant, MD [facility]         [1]  Allergies Allergen Reactions   Hydrocodone-Acetaminophen     Other reaction(s): VOMITING   Sulfa Antibiotics    "

## 2024-02-21 NOTE — Telephone Encounter (Signed)
 Patient has been advised of Pre-Admission date/time, and Surgery date at [facility].  Surgery Date: 04/23/24 Preadmission Testing Date: Preadmissions to call patient.    Patient informed of the scheduling process and surgery information given at time of office visit.   Patient has been made aware to call [phone], between 1-3:00pm the day before surgery, to find out what time to arrive for surgery.

## 2024-03-04 ENCOUNTER — Encounter: Payer: Self-pay | Admitting: Surgery

## 2024-03-05 ENCOUNTER — Telehealth: Payer: Self-pay | Admitting: General Surgery

## 2024-03-05 NOTE — Telephone Encounter (Signed)
 Updated information regarding rescheduled surgery. Surgery will now be done by Dr. Marinda.    Patient has been advised of Pre-Admission date/time, and Surgery date at [facility].  Surgery Date: 03/13/24 Preadmission Testing Date: Preadmissions to call patient.    Patient informed of the scheduling process and surgery information given.    Patient has been made aware to call [phone], between 1-3:00pm the day before surgery, to find out what time to arrive for surgery.

## 2024-03-06 ENCOUNTER — Encounter: Payer: Self-pay | Admitting: General Surgery

## 2024-03-06 ENCOUNTER — Ambulatory Visit: Admitting: General Surgery

## 2024-03-06 VITALS — BP 126/79 | HR 82 | Temp 98.1°F | Ht 65.0 in | Wt 178.8 lb

## 2024-03-06 DIAGNOSIS — K8050 Calculus of bile duct without cholangitis or cholecystitis without obstruction: Secondary | ICD-10-CM

## 2024-03-06 DIAGNOSIS — K8020 Calculus of gallbladder without cholecystitis without obstruction: Secondary | ICD-10-CM

## 2024-03-06 NOTE — Patient Instructions (Signed)
 You have requested to have your gallbladder removed. This will be done at [facility] with Dr. Marinda.   If you are on any injectable weight loss medication, you will need to stop taking your GLP-1 injectable (weight loss) medications 8 days before your surgery to avoid any complications with anesthesia.    You will most likely be out of work 1-2 weeks for this surgery.  If you have FMLA or disability paperwork that needs filled out you may drop this off at our office or this can be faxed to [phone].   You will return after your post-op appointment with a lifting restriction for approximately 4 more weeks.   You will be able to eat anything you would like to following surgery. But, start by eating a bland diet and advance this as tolerated. The Gallbladder diet is below, please go as closely by this diet as possible prior to surgery to avoid any further attacks.   Please see the (blue)pre-care form that you have been given today. You will receive several phone calls from [facility] prior to your surgery date.   If you have any questions, please call our office.   Laparoscopic Cholecystectomy Laparoscopic cholecystectomy is surgery to remove the gallbladder. The gallbladder is located in the upper right part of the abdomen, behind the liver. It is a storage sac for bile, which is produced in the liver. Bile aids in the digestion and absorption of fats. Cholecystectomy is often done for inflammation of the gallbladder (cholecystitis). This condition is usually caused by a buildup of gallstones (cholelithiasis) in the gallbladder. Gallstones can block the flow of bile, and that can result in inflammation and pain. In severe cases, emergency surgery may be required. If emergency surgery is not required, you will have time to prepare for the procedure. Laparoscopic surgery is an alternative to open surgery. Laparoscopic surgery has a shorter recovery time. Your common bile duct may also  need to be examined during the procedure. If stones are found in the common bile duct, they may be removed. LET [facility] CARE PROVIDER KNOW ABOUT: Any allergies you have. All medicines you are taking, including vitamins, herbs, eye drops, creams, and over-the-counter medicines. Previous problems you or members of your family have had with the use of anesthetics. Any blood disorders you have. Previous surgeries you have had.  Any medical conditions you have. RISKS AND COMPLICATIONS Generally, this is a safe procedure. However, problems may occur, including: Infection. Bleeding. Allergic reactions to medicines. Damage to other structures or organs. A stone remaining in the common bile duct. A bile leak from the cyst duct that is clipped when your gallbladder is removed. The need to convert to open surgery, which requires a larger incision in the abdomen. This may be necessary if your surgeon thinks that it is not safe to continue with a laparoscopic procedure. BEFORE THE PROCEDURE Ask your health care provider about: Changing or stopping your regular medicines. This is especially important if you are taking diabetes medicines or blood thinners. Taking medicines such as aspirin and ibuprofen . These medicines can thin your blood. Do not take these medicines before your procedure if your health care provider instructs you not to. Follow instructions from your health care provider about eating or drinking restrictions. Let your health care provider know if you develop a cold or an infection before surgery. Plan to have someone take you home after the procedure. Ask your health care provider how your surgical site will be  marked or identified. You may be given antibiotic medicine to help prevent infection. PROCEDURE To reduce your risk of infection: Your health care team will wash or sanitize their hands. Your skin will be washed with soap. An IV tube may be inserted into one of your  veins. You will be given a medicine to make you fall asleep (general anesthetic). A breathing tube will be placed in your mouth. The surgeon will make several small cuts (incisions) in your abdomen. A thin, lighted tube (laparoscope) that has a tiny camera on the end will be inserted through one of the small incisions. The camera on the laparoscope will send a picture to a TV screen (monitor) in the operating room. This will give the surgeon a good view inside your abdomen. A gas will be pumped into your abdomen. This will expand your abdomen to give the surgeon more room to perform the surgery. Other tools that are needed for the procedure will be inserted through the other incisions. The gallbladder will be removed through one of the incisions. After your gallbladder has been removed, the incisions will be closed with stitches (sutures), staples, or skin glue. Your incisions may be covered with a bandage (dressing). The procedure may vary among health care providers and hospitals. AFTER THE PROCEDURE Your blood pressure, heart rate, breathing rate, and blood oxygen level will be monitored often until the medicines you were given have worn off. You will be given medicines as needed to control your pain.   This information is not intended to replace advice given to you by your health care provider. Make sure you discuss any questions you have with your health care provider.   Document Released: 01/24/2005 Document Revised: 10/15/2014 Document Reviewed: 09/05/2012 Elsevier Interactive Patient Education 2016 [employer]     Low-Fat Diet for Gallbladder Conditions A low-fat diet can be helpful if you have pancreatitis or a gallbladder condition. With these conditions, your pancreas and gallbladder have trouble digesting fats. A healthy eating plan with less fat will help rest your pancreas and gallbladder and reduce your symptoms. WHAT DO I NEED TO KNOW ABOUT THIS DIET? Eat a low-fat  diet. Reduce your fat intake to less than 20-30% of your total daily calories. This is less than 50-60 g of fat per day. Remember that you need some fat in your diet. Ask your dietician what your daily goal should be. Choose nonfat and low-fat healthy foods. Look for the words nonfat, low fat, or fat free. As a guide, look on the label and choose foods with less than 3 g of fat per serving. Eat only one serving. Avoid alcohol. Do not smoke. If you need help quitting, talk with your health care provider. Eat small frequent meals instead of three large heavy meals. WHAT FOODS CAN I EAT? Grains Include healthy grains and starches such as potatoes, wheat bread, fiber-rich cereal, and brown rice. Choose whole grain options whenever possible. In adults, whole grains should account for 45-65% of your daily calories.  Fruits and Vegetables Eat plenty of fruits and vegetables. Fresh fruits and vegetables add fiber to your diet. Meats and Other Protein Sources Eat lean meat such as chicken and pork. Trim any fat off of meat before cooking it. Eggs, fish, and beans are other sources of protein. In adults, these foods should account for 10-35% of your daily calories. Dairy Choose low-fat milk and dairy options. Dairy includes fat and protein, as well as calcium.  Fats and Oils Limit high-fat  foods such as fried foods, sweets, baked goods, sugary drinks.  Other Creamy sauces and condiments, such as mayonnaise, can add extra fat. Think about whether or not you need to use them, or use smaller amounts or low fat options. WHAT FOODS ARE NOT RECOMMENDED? High fat foods, such as: [employer]. Ice cream. French toast. Sweet rolls. Pizza. Cheese bread. Foods covered with batter, butter, creamy sauces, or cheese. Fried foods. Sugary drinks and desserts. Foods that cause gas or bloating   This information is not intended to replace advice given to you by your health care provider. Make sure you  discuss any questions you have with your health care provider.   Document Released: 01/29/2013 Document Reviewed: 01/29/2013 Elsevier Interactive Patient Education [employer]

## 2024-03-08 ENCOUNTER — Other Ambulatory Visit: Payer: Self-pay

## 2024-03-08 ENCOUNTER — Encounter
Admission: RE | Admit: 2024-03-08 | Discharge: 2024-03-08 | Disposition: A | Source: Ambulatory Visit | Attending: General Surgery

## 2024-03-08 HISTORY — DX: Essential (primary) hypertension: I10

## 2024-03-08 HISTORY — DX: Calculus of gallbladder without cholecystitis without obstruction: K80.20

## 2024-03-08 HISTORY — DX: Malignant (primary) neoplasm, unspecified: C80.1

## 2024-03-08 NOTE — Patient Instructions (Addendum)
 Your procedure is scheduled on: 03/13/24 - Wednesday Report to the Registration Desk on the 1st floor of the [facility]. To find out your arrival time, please call [phone] between 1PM - 3PM on: 03/12/24 - Tuesday If your arrival time is 6:00 am, do not arrive before that time as the [facility] entrance doors do not open until 6:00 am.  REMEMBER: Instructions that are not followed completely may result in serious medical risk, up to and including death; or upon the discretion of your surgeon and anesthesiologist your surgery may need to be rescheduled.  Do not eat food after midnight the night before surgery.  No gum chewing or hard candies.  You may however, drink CLEAR liquids up to 2 hours before you are scheduled to arrive for your surgery. Do not drink anything within 2 hours of your scheduled arrival time.  Clear liquids include: - water  - apple juice without pulp - gatorade (not RED colors) - black coffee or tea (Do NOT add milk or creamers to the coffee or tea) Do NOT drink anything that is not on this list.  One week prior to surgery: Stop Anti-inflammatories (NSAIDS) such as ketorolac  (TORADOL ) , Advil , Aleve, Ibuprofen , Motrin , Naproxen, Naprosyn and Aspirin based products such as Excedrin, Goody's Powder, BC Powder.You may continue to take Tylenol  if needed for pain up until the day of surgery.  Stop ANY OVER THE COUNTER supplements until after surgery.  tirzepatide  (ZEPBOUND ) hold 7 days prior to surgery, Hold dose on 03/10/24.  ON THE DAY OF SURGERY ONLY TAKE THESE MEDICATIONS WITH SIPS OF WATER:  tolterodine (DETROL LA)    No Alcohol for 24 hours before or after surgery.  No Smoking including e-cigarettes for 24 hours before surgery.  No chewable tobacco products for at least 6 hours before surgery.  No nicotine patches on the day of surgery.  Do not use any recreational drugs for at least a week (preferably 2 weeks) before your surgery.  Please be  advised that the combination of cocaine and anesthesia may have negative outcomes, up to and including death. If you test positive for cocaine, your surgery will be cancelled.  On the morning of surgery brush your teeth with toothpaste and water, you may rinse your mouth with mouthwash if you wish. Do not swallow any toothpaste or mouthwash.  Use dial antibacterial Soap on the night before surgery.  Do not wear jewelry, make-up, hairpins, clips or nail polish.  For welded (permanent) jewelry: bracelets, anklets, waist bands, etc.  Please have this removed prior to surgery.  If it is not removed, there is a chance that hospital personnel will need to cut it off on the day of surgery.  Do not wear lotions, powders, or perfumes.   Do not shave body hair from the neck down 48 hours before surgery.  Contact lenses, hearing aids and dentures may not be worn into surgery.  Do not bring valuables to the hospital. [facility] is not responsible for any missing/lost belongings or valuables.   Bring your C-PAP to the hospital in case you may have to spend the night.   Notify your doctor if there is any change in your medical condition (cold, fever, infection).  Wear comfortable clothing (specific to your surgery type) to the hospital.  After surgery, you can help prevent lung complications by doing breathing exercises.  Take deep breaths and cough every 1-2 hours. Your doctor may order a device called an Incentive Spirometer to help  you take deep breaths.  When coughing or sneezing, hold a pillow firmly against your incision with both hands. This is called splinting. Doing this helps protect your incision. It also decreases belly discomfort.  If you are being admitted to the hospital overnight, leave your suitcase in the car. After surgery it may be brought to your room.  In case of increased patient census, it may be necessary for you, the patient, to continue your postoperative care in the  Same Day Surgery department.  If you are being discharged the day of surgery, you will not be allowed to drive home. You will need a responsible individual to drive you home and stay with you for 24 hours after surgery.   If you are taking public transportation, you will need to have a responsible individual with you.  Please call the Pre-admissions Testing Dept. at [phone] if you have any questions about these instructions.  Surgery Visitation Policy:  Patients having surgery or a procedure may have two visitors.  Children under the age of 47 must have an adult with them who is not the patient.  Inpatient Visitation:    Visiting hours are 7 a.m. to 8 p.m. Up to four visitors are allowed at one time in a patient room. The visitors may rotate out with other people during the day.  One visitor age 84 or older may stay with the patient overnight and must be in the room by 8 p.m.   Merchandiser, Retail to address health-related social needs:  [email or web address]                                                                                                            Preparing for Surgery with CHLORHEXIDINE  GLUCONATE (CHG) Soap  Chlorhexidine  Gluconate (CHG) Soap  o An antiseptic cleaner that kills germs and bonds with the skin to continue killing germs even after washing  o Used for showering the night before surgery and morning of surgery  Before surgery, you can play an important role by reducing the number of germs on your skin.  CHG (Chlorhexidine  gluconate) soap is an antiseptic cleanser which kills germs and bonds with the skin to continue killing germs even after washing.  Please do not use if you have an allergy to CHG or antibacterial soaps. If your skin becomes reddened/irritated stop using the CHG.  1. Shower the NIGHT BEFORE SURGERY with CHG soap.  2. If you choose to wash your hair, wash your hair first as usual with your normal  shampoo.  3. After shampooing, rinse your hair and body thoroughly to remove the shampoo.  4. Use CHG as you would any other liquid soap. You can apply CHG directly to the skin and wash gently with a clean washcloth.  5. Apply the CHG soap to your body only from the neck down. Do not use on open wounds or open sores. Avoid contact with your eyes, ears, mouth, and genitals (private parts). Wash face and genitals (private parts) with your normal soap.  6. Wash  thoroughly, paying special attention to the area where your surgery will be performed.  7. Thoroughly rinse your body with warm water.  8. Do not shower/wash with your normal soap after using and rinsing off the CHG soap.  9. Do not use lotions, oils, etc., after showering with CHG.  10. Pat yourself dry with a clean towel.  11. Wear clean pajamas to bed the night before surgery.  12. Place clean sheets on your bed the night of your shower and do not sleep with pets.  13. Do not apply any deodorants/lotions/powders.  14. Please wear clean clothes to the hospital.  15. Remember to brush your teeth with your regular toothpaste.

## 2024-03-13 ENCOUNTER — Encounter: Admission: RE | Disposition: A | Payer: Self-pay | Source: Home / Self Care | Attending: General Surgery

## 2024-03-13 ENCOUNTER — Encounter: Payer: Self-pay | Admitting: General Surgery

## 2024-03-13 ENCOUNTER — Ambulatory Visit: Admitting: Anesthesiology

## 2024-03-13 ENCOUNTER — Ambulatory Visit
Admission: RE | Admit: 2024-03-13 | Discharge: 2024-03-13 | Disposition: A | Source: Home / Self Care | Attending: General Surgery | Admitting: General Surgery

## 2024-03-13 ENCOUNTER — Encounter: Payer: Self-pay | Admitting: Urgent Care

## 2024-03-13 ENCOUNTER — Other Ambulatory Visit: Payer: Self-pay

## 2024-03-13 DIAGNOSIS — K8020 Calculus of gallbladder without cholecystitis without obstruction: Secondary | ICD-10-CM

## 2024-03-13 DIAGNOSIS — K8050 Calculus of bile duct without cholangitis or cholecystitis without obstruction: Secondary | ICD-10-CM | POA: Diagnosis not present

## 2024-03-13 MED ORDER — SUGAMMADEX SODIUM 200 MG/2ML IV SOLN
INTRAVENOUS | Status: DC | PRN
  Administered 2024-03-13: 200 mg via INTRAVENOUS

## 2024-03-13 MED ORDER — GLYCOPYRROLATE 0.2 MG/ML IJ SOLN
INTRAMUSCULAR | Status: DC | PRN
  Administered 2024-03-13: .1 mg via INTRAVENOUS

## 2024-03-13 MED ORDER — LACTATED RINGERS IV SOLN: INTRAVENOUS | Status: DC

## 2024-03-13 MED ORDER — CHLORHEXIDINE GLUCONATE 0.12 % MT SOLN
15.0000 mL | Freq: Once | OROMUCOSAL | Status: AC
  Administered 2024-03-13: 15 mL via OROMUCOSAL

## 2024-03-13 MED ORDER — CHLORHEXIDINE GLUCONATE 0.12 % MT SOLN
OROMUCOSAL | Status: AC
  Filled 2024-03-13: qty 15

## 2024-03-13 MED ORDER — CEFAZOLIN SODIUM-DEXTROSE 2-4 GM/100ML-% IV SOLN
2.0000 g | INTRAVENOUS | Status: AC
  Administered 2024-03-13: 2 g via INTRAVENOUS

## 2024-03-13 MED ORDER — BUPIVACAINE-EPINEPHRINE (PF) 0.5% -1:200000 IJ SOLN
INTRAMUSCULAR | Status: DC | PRN
  Administered 2024-03-13: 30 mL
  Administered 2024-03-13: 10 mL

## 2024-03-13 MED ORDER — GABAPENTIN 300 MG PO CAPS
ORAL_CAPSULE | ORAL | Status: AC
  Filled 2024-03-13: qty 1

## 2024-03-13 MED ORDER — INDOCYANINE GREEN 25 MG IJ SOLR
1.2500 mg | INTRAMUSCULAR | Status: AC
  Administered 2024-03-13: 1.25 mg via INTRAVENOUS

## 2024-03-13 MED ORDER — ONDANSETRON HCL 4 MG/2ML IJ SOLN
INTRAMUSCULAR | Status: DC | PRN
  Administered 2024-03-13: 4 mg via INTRAVENOUS

## 2024-03-13 MED ORDER — ORAL CARE MOUTH RINSE: 15.0000 mL | Freq: Once | OROMUCOSAL | Status: AC

## 2024-03-13 MED ORDER — PROPOFOL 10 MG/ML IV BOLUS
INTRAVENOUS | Status: DC | PRN
  Administered 2024-03-13: 150 mg via INTRAVENOUS

## 2024-03-13 MED ORDER — FENTANYL CITRATE (PF) 100 MCG/2ML IJ SOLN
25.0000 ug | INTRAMUSCULAR | Status: DC | PRN
  Administered 2024-03-13 (×4): 25 ug via INTRAVENOUS

## 2024-03-13 MED ORDER — GABAPENTIN 300 MG PO CAPS
300.0000 mg | ORAL_CAPSULE | ORAL | Status: AC
  Administered 2024-03-13: 300 mg via ORAL

## 2024-03-13 MED ORDER — CEFAZOLIN SODIUM-DEXTROSE 2-4 GM/100ML-% IV SOLN
INTRAVENOUS | Status: AC
  Filled 2024-03-13: qty 100

## 2024-03-13 MED ORDER — OXYCODONE HCL 5 MG PO TABS: 5.0000 mg | ORAL_TABLET | Freq: Four times a day (QID) | ORAL | 0 refills | Status: AC | PRN

## 2024-03-13 MED ORDER — ORAL CARE MOUTH RINSE: 15.0000 mL | Freq: Once | OROMUCOSAL | Status: DC

## 2024-03-13 MED ORDER — ACETAMINOPHEN 500 MG PO TABS
ORAL_TABLET | ORAL | Status: AC
  Filled 2024-03-13: qty 2

## 2024-03-13 MED ORDER — CHLORHEXIDINE GLUCONATE CLOTH 2 % EX PADS: 6.0000 | MEDICATED_PAD | Freq: Once | CUTANEOUS | Status: DC

## 2024-03-13 MED ORDER — BUPIVACAINE-EPINEPHRINE (PF) 0.5% -1:200000 IJ SOLN
INTRAMUSCULAR | Status: AC
  Filled 2024-03-13: qty 30

## 2024-03-13 MED ORDER — KETAMINE HCL 50 MG/5ML IJ SOSY
PREFILLED_SYRINGE | INTRAMUSCULAR | Status: DC | PRN
  Administered 2024-03-13: 30 mg via INTRAVENOUS

## 2024-03-13 MED ORDER — ROCURONIUM BROMIDE 10 MG/ML (PF) SYRINGE
PREFILLED_SYRINGE | INTRAVENOUS | Status: AC
  Filled 2024-03-13: qty 10

## 2024-03-13 MED ORDER — DEXAMETHASONE SOD PHOSPHATE PF 10 MG/ML IJ SOLN
INTRAMUSCULAR | Status: AC
  Filled 2024-03-13: qty 1

## 2024-03-13 MED ORDER — FENTANYL CITRATE (PF) 100 MCG/2ML IJ SOLN
INTRAMUSCULAR | Status: DC | PRN
  Administered 2024-03-13: 50 ug via INTRAVENOUS
  Administered 2024-03-13 (×2): 25 ug via INTRAVENOUS

## 2024-03-13 MED ORDER — ROCURONIUM BROMIDE 100 MG/10ML IV SOLN
INTRAVENOUS | Status: DC | PRN
  Administered 2024-03-13: 40 mg via INTRAVENOUS

## 2024-03-13 MED ORDER — INDOCYANINE GREEN 25 MG IJ SOLR
INTRAMUSCULAR | Status: AC
  Filled 2024-03-13: qty 10

## 2024-03-13 MED ORDER — FENTANYL CITRATE (PF) 100 MCG/2ML IJ SOLN
INTRAMUSCULAR | Status: AC
  Filled 2024-03-13: qty 2

## 2024-03-13 MED ORDER — OXYCODONE HCL 5 MG PO TABS
5.0000 mg | ORAL_TABLET | Freq: Once | ORAL | Status: AC | PRN
  Administered 2024-03-13: 5 mg via ORAL

## 2024-03-13 MED ORDER — KETAMINE HCL 50 MG/5ML IJ SOSY
PREFILLED_SYRINGE | INTRAMUSCULAR | Status: AC
  Filled 2024-03-13: qty 5

## 2024-03-13 MED ORDER — OXYCODONE HCL 5 MG PO TABS
ORAL_TABLET | ORAL | Status: AC
  Filled 2024-03-13: qty 1

## 2024-03-13 MED ORDER — MIDAZOLAM HCL 2 MG/2ML IJ SOLN
INTRAMUSCULAR | Status: AC
  Filled 2024-03-13: qty 2

## 2024-03-13 MED ORDER — ACETAMINOPHEN 500 MG PO TABS
1000.0000 mg | ORAL_TABLET | ORAL | Status: AC
  Administered 2024-03-13: 1000 mg via ORAL

## 2024-03-13 MED ORDER — ONDANSETRON HCL 4 MG/2ML IJ SOLN
INTRAMUSCULAR | Status: AC
  Filled 2024-03-13: qty 2

## 2024-03-13 MED ORDER — LIDOCAINE HCL (CARDIAC) PF 100 MG/5ML IV SOSY
PREFILLED_SYRINGE | INTRAVENOUS | Status: DC | PRN
  Administered 2024-03-13: 100 mg via INTRAVENOUS

## 2024-03-13 MED ORDER — METOCLOPRAMIDE HCL 5 MG/ML IJ SOLN: 10.0000 mg | Freq: Once | INTRAMUSCULAR | Status: DC | PRN

## 2024-03-13 MED ORDER — CHLORHEXIDINE GLUCONATE CLOTH 2 % EX PADS
6.0000 | MEDICATED_PAD | Freq: Once | CUTANEOUS | Status: AC
  Administered 2024-03-13: 6 via TOPICAL

## 2024-03-13 MED ORDER — KETOROLAC TROMETHAMINE 30 MG/ML IJ SOLN
INTRAMUSCULAR | Status: DC | PRN
  Administered 2024-03-13: 30 mg via INTRAVENOUS

## 2024-03-13 MED ORDER — MIDAZOLAM HCL (PF) 2 MG/2ML IJ SOLN
INTRAMUSCULAR | Status: DC | PRN
  Administered 2024-03-13: 2 mg via INTRAVENOUS

## 2024-03-13 MED ORDER — PHENYLEPHRINE 80 MCG/ML (10ML) SYRINGE FOR IV PUSH (FOR BLOOD PRESSURE SUPPORT)
PREFILLED_SYRINGE | INTRAVENOUS | Status: DC | PRN
  Administered 2024-03-13 (×3): 80 ug via INTRAVENOUS

## 2024-03-13 MED ORDER — LIDOCAINE HCL (PF) 2 % IJ SOLN
INTRAMUSCULAR | Status: AC
  Filled 2024-03-13: qty 5

## 2024-03-13 MED ORDER — DEXAMETHASONE SOD PHOSPHATE PF 10 MG/ML IJ SOLN
INTRAMUSCULAR | Status: DC | PRN
  Administered 2024-03-13: 5 mg via INTRAVENOUS

## 2024-03-13 MED ORDER — PROPOFOL 10 MG/ML IV BOLUS
INTRAVENOUS | Status: AC
  Filled 2024-03-13: qty 20

## 2024-03-13 MED ORDER — CHLORHEXIDINE GLUCONATE 0.12 % MT SOLN: 15.0000 mL | Freq: Once | OROMUCOSAL | Status: DC

## 2024-03-13 MED ORDER — OXYCODONE HCL 5 MG/5ML PO SOLN: 5.0000 mg | Freq: Once | ORAL | Status: AC | PRN

## 2024-03-13 NOTE — Op Note (Signed)
 Robotic assisted laparoscopic Cholecystectomy  Pre-operative Diagnosis: biliary colic  Post-operative Diagnosis: Same  Procedure:  Robotic assisted laparoscopic Cholecystectomy  Surgeon: Jayson Endow, MD  Anesthesia: Gen. with endotracheal tube  Findings: Critical view of safety obtained  Estimated Blood Loss: 5cc       Specimens: Gallbladder           Complications: none   Procedure Details  The patient was seen again in the Holding Room. The benefits, complications, treatment options, and expected outcomes were discussed with the patient. The risks of bleeding, infection, recurrence of symptoms, failure to resolve symptoms, bile duct damage, bile duct leak, retained common bile duct stone, bowel injury, any of which could require further surgery and/or ERCP, stent, or papillotomy were reviewed with the patient. The likelihood of improving the patient's symptoms with return to their baseline status is good.  The patient and/or family concurred with the proposed plan, giving informed consent.  The patient was taken to Operating Room, identified  and the procedure verified as robotic Cholecystectomy.  A Time Out was held and the above information confirmed.  Prior to the induction of general anesthesia, antibiotic prophylaxis was administered. VTE prophylaxis was in place. General endotracheal anesthesia was then administered and tolerated well. After the induction, the abdomen was prepped with Chloraprep and draped in the sterile fashion. The patient was positioned in the supine position.  A veress needle was inserted into the abdomen using standard drop technique. An 8mm infra-umbilical robotic port was then placed under direct visualization. There was no injury noted at the site of veress needle insertion. Two right sided abdominal 8mm ports followed by an 8mm left abdominal robotic ports were placed under direct visualization. The left sided abdominal port was then upsized to a 12mm  robotic port.  The patient was positioned  in reverse Trendelenburg, robot was brought to the surgical field and docked in the standard fashion.  We made sure all the instrumentation was kept indirect view at all times and that there were no collision between the arms. I scrubbed out and went to the console.  The gallbladder was identified, the fundus grasped and retracted cephalad. Adhesions were lysed bluntly. The infundibulum was grasped and retracted laterally, exposing the peritoneum overlying the triangle of Calot. This was then divided and exposed in a blunt fashion. An extended critical view of the cystic duct and cystic artery was obtained.  The cystic duct was clearly identified and bluntly dissected.   Artery and duct were double clipped and divided. Using ICG cholangiography we visualized the cystic duct. The gallbladder was taken from the gallbladder fossa in a retrograde fashion with the electrocautery.  Hemostasis was achieved with the electrocautery. nspection of the right upper quadrant was performed. No bleeding, bile duct injury or leak, or bowel injury was noted. Robotic instruments and robotic arms were undocked in the standard fashion.  I scrubbed back in.  The gallbladder was removed and placed in an Endocatch bag.   The left lower quadrant fascia was then closed with a 0 vicryl using a suture needle passer. The pre-peritoneal space was then infiltrated with liposomal bupivicaine and marcaine solution. Pneumoperitoneum was released.  4-0 subcuticular Monocryl was used to close the skin. Dermabond was  applied.  The patient was then extubated and brought to the recovery room in stable condition. Sponge, lap, and needle counts were correct at closure and at the conclusion of the case.  Jayson Endow, M.D.  [facility]

## 2024-03-13 NOTE — Transfer of Care (Signed)
 Immediate Anesthesia Transfer of Care Note  Patient: Anne Waters  Procedure(s) Performed: CHOLECYSTECTOMY, ROBOT-ASSISTED, LAPAROSCOPIC  Patient Location: PACU  Anesthesia Type:General  Level of Consciousness: awake, alert , and patient cooperative  Airway & Oxygen Therapy: Patient Spontanous Breathing and Patient connected to face mask oxygen  Post-op Assessment: Report given to RN, Post -op Vital signs reviewed and stable, and Patient moving all extremities  Post vital signs: Reviewed and stable  Last Vitals:  Vitals Value Taken Time  BP 88/72 03/13/24 13:08  Temp 36.2 C 03/13/24 13:08  Pulse 81 03/13/24 13:10  Resp 22 03/13/24 13:10  SpO2 100 % 03/13/24 13:10  Vitals shown include unfiled device data.  Last Pain:  Vitals:   03/13/24 1011  TempSrc: Temporal  PainSc: 0-No pain         Complications: No notable events documented.

## 2024-03-13 NOTE — Anesthesia Postprocedure Evaluation (Signed)
"   Anesthesia Post Note  Patient: Anne Waters  Procedure(s) Performed: CHOLECYSTECTOMY, ROBOT-ASSISTED, LAPAROSCOPIC  Patient location during evaluation: PACU Anesthesia Type: General Level of consciousness: awake and alert Pain management: pain level controlled Vital Signs Assessment: post-procedure vital signs reviewed and stable Respiratory status: spontaneous breathing, nonlabored ventilation, respiratory function stable and patient connected to nasal cannula oxygen Cardiovascular status: blood pressure returned to baseline and stable Postop Assessment: no apparent nausea or vomiting Anesthetic complications: no   No notable events documented.   Last Vitals:  Vitals:   03/13/24 1308 03/13/24 1315  BP: (!) 88/72 107/60  Pulse: 85 78  Resp: 16 18  Temp: (!) 36.2 C   SpO2: 100% 100%    Last Pain:  Vitals:   03/13/24 1315  TempSrc:   PainSc: Asleep                 Redell MARLA Breaker      "
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
## 2024-03-13 NOTE — Anesthesia Preprocedure Evaluation (Signed)
"                                    Anesthesia Evaluation  Patient identified by MRN, date of birth, ID band Patient awake    Reviewed: Allergy & Precautions, H&P , NPO status , Patient's Chart, lab work & pertinent test results  Airway Mallampati: II  TM Distance: >3 FB Neck ROM: Full    Dental no notable dental hx.    Pulmonary sleep apnea    Pulmonary exam normal breath sounds clear to auscultation       Cardiovascular hypertension, Normal cardiovascular exam Rhythm:Regular Rate:Normal     Neuro/Psych  PSYCHIATRIC DISORDERS Anxiety Depression     Neuromuscular disease    GI/Hepatic negative GI ROS, Neg liver ROS,,,  Endo/Other  negative endocrine ROS    Renal/GU negative Renal ROS  negative genitourinary   Musculoskeletal negative musculoskeletal ROS (+)    Abdominal   Peds negative pediatric ROS (+)  Hematology negative hematology ROS (+)   Anesthesia Other Findings   Reproductive/Obstetrics negative OB ROS                              Anesthesia Physical Anesthesia Plan  ASA: 2  Anesthesia Plan: General   Post-op Pain Management:    Induction: Intravenous  PONV Risk Score and Plan:   Airway Management Planned: Oral ETT  Additional Equipment:   Intra-op Plan:   Post-operative Plan: Extubation in OR  Informed Consent: I have reviewed the patients History and Physical, chart, labs and discussed the procedure including the risks, benefits and alternatives for the proposed anesthesia with the patient or authorized representative who has indicated his/her understanding and acceptance.     Dental advisory given  Plan Discussed with: CRNA  Anesthesia Plan Comments:         Anesthesia Quick Evaluation  "

## 2024-03-13 NOTE — Anesthesia Procedure Notes (Signed)
 Procedure Name: Intubation Date/Time: 03/13/2024 11:58 AM  Performed by: Porter Kung, RNPre-anesthesia Checklist: Patient identified, Patient being monitored, Timeout performed, Emergency Drugs available and Suction available Patient Re-evaluated:Patient Re-evaluated prior to induction Oxygen Delivery Method: Circle system utilized Preoxygenation: Pre-oxygenation with 100% oxygen Induction Type: IV induction Ventilation: Mask ventilation without difficulty Laryngoscope Size: Mac and 4 Grade View: Grade I Tube type: Oral Tube size: 7.0 mm Number of attempts: 1 Airway Equipment and Method: Stylet Placement Confirmation: ETT inserted through vocal cords under direct vision, positive ETCO2 and breath sounds checked- equal and bilateral Secured at: 21 cm Tube secured with: Tape Dental Injury: Teeth and Oropharynx as per pre-operative assessment

## 2024-03-25 ENCOUNTER — Ambulatory Visit: Admitting: Obstetrics

## 2024-03-28 ENCOUNTER — Telehealth: Admitting: General Surgery

## 2024-04-10 ENCOUNTER — Ambulatory Visit: Admitting: Surgery

## 2024-05-08 ENCOUNTER — Ambulatory Visit

## 2024-06-05 ENCOUNTER — Ambulatory Visit: Admitting: Family Medicine
